# Patient Record
Sex: Female | Born: 1970
Health system: Southern US, Community
[De-identification: ages and names within clinical notes are randomized; demographics above are authoritative.]

## PROBLEM LIST (undated history)

## (undated) DIAGNOSIS — R51 Headache: Secondary | ICD-10-CM

## (undated) DIAGNOSIS — K219 Gastro-esophageal reflux disease without esophagitis: Secondary | ICD-10-CM

## (undated) DIAGNOSIS — M549 Dorsalgia, unspecified: Secondary | ICD-10-CM

## (undated) DIAGNOSIS — G8929 Other chronic pain: Secondary | ICD-10-CM

## (undated) DIAGNOSIS — R112 Nausea with vomiting, unspecified: Secondary | ICD-10-CM

## (undated) DIAGNOSIS — D649 Anemia, unspecified: Secondary | ICD-10-CM

## (undated) DIAGNOSIS — E079 Disorder of thyroid, unspecified: Secondary | ICD-10-CM

## (undated) DIAGNOSIS — F419 Anxiety disorder, unspecified: Secondary | ICD-10-CM

## (undated) DIAGNOSIS — R519 Headache, unspecified: Secondary | ICD-10-CM

## (undated) DIAGNOSIS — Z973 Presence of spectacles and contact lenses: Secondary | ICD-10-CM

## (undated) DIAGNOSIS — Z9889 Other specified postprocedural states: Secondary | ICD-10-CM

## (undated) HISTORY — PX: ECTOPIC PREGNANCY SURGERY: SHX613

## (undated) HISTORY — PX: CHOLECYSTECTOMY: SHX55

## (undated) HISTORY — PX: DILATION AND CURETTAGE OF UTERUS: SHX78

---

## 2006-09-10 ENCOUNTER — Ambulatory Visit (HOSPITAL_COMMUNITY): Payer: Self-pay | Admitting: Psychiatry

## 2006-10-01 ENCOUNTER — Ambulatory Visit (HOSPITAL_COMMUNITY): Payer: Self-pay | Admitting: Psychiatry

## 2006-11-01 ENCOUNTER — Ambulatory Visit (HOSPITAL_COMMUNITY): Payer: Self-pay | Admitting: Psychiatry

## 2007-08-15 ENCOUNTER — Other Ambulatory Visit: Admission: RE | Admit: 2007-08-15 | Discharge: 2007-08-15 | Payer: Self-pay | Admitting: Family Medicine

## 2007-11-27 ENCOUNTER — Encounter (INDEPENDENT_AMBULATORY_CARE_PROVIDER_SITE_OTHER): Payer: Self-pay | Admitting: Obstetrics and Gynecology

## 2007-11-27 ENCOUNTER — Observation Stay (HOSPITAL_COMMUNITY): Admission: AD | Admit: 2007-11-27 | Discharge: 2007-11-27 | Payer: Self-pay | Admitting: Obstetrics and Gynecology

## 2007-12-03 ENCOUNTER — Inpatient Hospital Stay (HOSPITAL_COMMUNITY): Admission: AD | Admit: 2007-12-03 | Discharge: 2007-12-03 | Payer: Self-pay | Admitting: Obstetrics and Gynecology

## 2007-12-06 ENCOUNTER — Ambulatory Visit: Admission: RE | Admit: 2007-12-06 | Discharge: 2007-12-06 | Payer: Self-pay | Admitting: Family Medicine

## 2007-12-11 ENCOUNTER — Inpatient Hospital Stay (HOSPITAL_COMMUNITY): Admission: AD | Admit: 2007-12-11 | Discharge: 2007-12-11 | Payer: Self-pay | Admitting: Obstetrics and Gynecology

## 2009-08-26 ENCOUNTER — Encounter: Admission: RE | Admit: 2009-08-26 | Discharge: 2009-08-26 | Payer: Self-pay | Admitting: Family Medicine

## 2010-12-02 NOTE — Op Note (Signed)
[name], [name]           ACCOUNT NO.:  [id number]   MEDICAL RECORD NO.:  [id number]          PATIENT TYPE:  OBV   LOCATION:  9318                          FACILITY:  [facility]   PHYSICIAN:  Osborn Coho, M.D.   DATE OF BIRTH:  1970-08-30   DATE OF PROCEDURE:  11/27/2007  DATE OF DISCHARGE:                               OPERATIVE REPORT   PREOPERATIVE DIAGNOSIS:  Ruptured right ectopic pregnancy.   POSTOPERATIVE DIAGNOSIS:  Aborted right ectopic pregnancy.   PROCEDURE:  1. Laparoscopy.  2. Removal of ectopic pregnancy.  3. Chromopertubation.   ATTENDING:  Osborn Coho, MD.   ASSISTANT:  Rhona Leavens, CNM   ANESTHESIA:  General.   FINDINGS:  Aborted ectopic pregnancy attached to bowel and posterior cul-  de-sac in appearance, normal-appearing appendix, bilateral free spill  from fallopian tubes, and normal-appearing ovaries and tubes.   SPECIMEN TO PATHOLOGY:  Ectopic tissue.   FLUIDS:  2100 mL.   URINE OUTPUT:  100 mL.   ESTIMATED BLOOD LOSS:  Approximately 100-200 mL of hemoperitoneum and  minimal blood loss intraoperatively.   COMPLICATIONS:  None.   PROCEDURE:  The patient was taken to the operating room after the risks,  benefits, and alternatives were discussed with the patient.  The patient  verbalized understanding. Consent signed and witnessed.  The patient was  placed under general anesthesia and prepped and draped in the normal  sterile fashion in the dorsal lithotomy position.  A single-tooth  tenaculum was placed on the anterior lip of the cervix.  An acorn  intrauterine manipulator was placed as well.  Attention was then turned  to the abdomen after gowning and re-gloving and a 10-mm incision was  made at the umbilicus.  Veress needle attempted to be introduced into  the intra-abdominal cavity but kept tracking in the subcutaneous tissue.  Open laparoscopy was then performed and the peritoneum entered.  The  Hasson was placed and  pneumoperitoneum achieved.  The laparoscope was  introduced and in the left lower quadrant, a 5-mm incision was made and  5-mm trocar advanced under direct visualization.  In the suprapubic  region, two fingerbreadths above the pelvic bone, a 10-mm incision was  made in the midline and trocar advanced under direct visualization.  Pneumoperitoneum was noted as mentioned above.  The intra-abdominal  cavity was copiously irrigated and hemoperitoneum evacuated in order to  identify the ectopic.  There was no obvious ectopic in either of the  fallopian tubes.  However, there was the appearance of what looked like  ectopic tissue adjacent to the right fallopian tube attached to the  bowel.  This tissue was removed and sent to pathology.  The remainder of  the intra-abdominal cavity was copiously irrigated and there was also  the appearance of ectopic tissue in the posterior cul-de-sac, which was  also removed.  Chromopertubation was performed and bilateral free spill  from fallopian tubes were noted.  The intra-abdominal cavity was  copiously irrigated once again and as much as possible the remaining  fluid in the intra-abdominal cavity was suctioned.  The appearance was  that an aborted ectopic  pregnancy from presumably the right fallopian  tube with no obvious remaining tissue in the tube.  The tube was  therefore left intact and decision was made to offer the patient  methotrexate and to follow the beta hCG down to 0, which would be  discussed with the patient following the procedure.  There was no  bleeding noted and the suprapubic and left lower quadrant trocars were  removed under direct visualization and pneumoperitoneum relieved.  The  Hasson was then removed under direct visualization and the fascia  repaired at the umbilicus with 0 Vicryl via a running stitch and the  skin reapproximated using 3-0 Monocryl.  At the suprapubic incision, the  fascia was repaired with 0 Vicryl as above  and skin repaired with 3-0  Monocryl via a subcuticular stitch.  The left lower quadrant was  repaired with Dermabond and Dermabond was placed on the 10-mm incisions  as well.  Sponge, lap, and needle count was correct.  The patient  tolerated the procedure well.  The tenaculum and acorn intrauterine  manipulator were removed and the patient was returned to the recovery  room in good condition.      Osborn Coho, M.D.  Electronically Signed     AR/MEDQ  D:  11/30/2007  T:  11/30/2007  Job:  [id number]

## 2011-04-15 LAB — HCG, QUANTITATIVE, PREGNANCY
hCG, Beta Chain, Quant, S: 3
hCG, Beta Chain, Quant, S: 57 — ABNORMAL HIGH

## 2012-12-31 ENCOUNTER — Encounter (HOSPITAL_BASED_OUTPATIENT_CLINIC_OR_DEPARTMENT_OTHER): Payer: Self-pay | Admitting: *Deleted

## 2012-12-31 ENCOUNTER — Emergency Department (HOSPITAL_BASED_OUTPATIENT_CLINIC_OR_DEPARTMENT_OTHER)
Admission: EM | Admit: 2012-12-31 | Discharge: 2012-12-31 | Disposition: A | Discharge status: Home / Self Care | Payer: Worker's Compensation | Attending: Emergency Medicine | Admitting: Emergency Medicine

## 2012-12-31 DIAGNOSIS — S29012A Strain of muscle and tendon of back wall of thorax, initial encounter: Secondary | ICD-10-CM

## 2012-12-31 DIAGNOSIS — Y9389 Activity, other specified: Secondary | ICD-10-CM | POA: Insufficient documentation

## 2012-12-31 DIAGNOSIS — Y9289 Other specified places as the place of occurrence of the external cause: Secondary | ICD-10-CM | POA: Insufficient documentation

## 2012-12-31 DIAGNOSIS — X503XXA Overexertion from repetitive movements, initial encounter: Secondary | ICD-10-CM | POA: Insufficient documentation

## 2012-12-31 DIAGNOSIS — S239XXA Sprain of unspecified parts of thorax, initial encounter: Secondary | ICD-10-CM | POA: Insufficient documentation

## 2012-12-31 DIAGNOSIS — Y990 Civilian activity done for income or pay: Secondary | ICD-10-CM | POA: Insufficient documentation

## 2012-12-31 DIAGNOSIS — Z79899 Other long term (current) drug therapy: Secondary | ICD-10-CM | POA: Insufficient documentation

## 2012-12-31 MED ORDER — DIAZEPAM 5 MG PO TABS
5.0000 mg | ORAL_TABLET | Freq: Once | ORAL | Status: AC
  Administered 2012-12-31: 5 mg via ORAL
  Filled 2012-12-31: qty 1

## 2012-12-31 MED ORDER — CYCLOBENZAPRINE HCL 10 MG PO TABS: 10.0000 mg | ORAL_TABLET | Freq: Two times a day (BID) | ORAL | Status: DC | PRN

## 2012-12-31 MED ORDER — KETOROLAC TROMETHAMINE 60 MG/2ML IM SOLN
60.0000 mg | Freq: Once | INTRAMUSCULAR | Status: AC
  Administered 2012-12-31: 60 mg via INTRAMUSCULAR
  Filled 2012-12-31: qty 2

## 2012-12-31 NOTE — ED Notes (Signed)
Pt states she injured her back and neck transferring a pt yesterday. Ambulatory to ED without difficulty.

## 2012-12-31 NOTE — ED Provider Notes (Signed)
Medical screening examination/treatment/procedure(s) were performed by non-physician practitioner and as supervising physician I was immediately available for consultation/collaboration.   Charles B. Bernette Mayers, MD 12/31/12 1191

## 2012-12-31 NOTE — ED Notes (Signed)
Kaitlyn, PAC at bedside.  

## 2012-12-31 NOTE — ED Provider Notes (Signed)
History     CSN: [id number]  Arrival date & time 12/31/12  1616   First MD Initiated Contact with Patient 12/31/12 1644      Chief Complaint  Patient presents with  . 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 The pain is aching and severe and does not radiate. The pain is constant. Movement makes the pain worse. Nothing makes the pain better. Patient has not tried anything for pain. No associated symptoms. No saddles paresthesias or bladder/bowel incontinence.      History reviewed. No pertinent past medical history.  Past Surgical History  Procedure Laterality Date  . Cholecystectomy      History reviewed. No pertinent family history.  History  Substance Use Topics  . Smoking status: Never Smoker   . Smokeless tobacco: Not on file  . Alcohol Use: Yes    OB History   Grav Para Term Preterm Abortions TAB SAB Ect Mult Living                  Review of Systems  Musculoskeletal: Positive for back pain.  All other systems reviewed and are negative.    Allergies  Review of patient's allergies indicates no known allergies.  Home Medications   Current Outpatient Rx  Name  Route  Sig  Dispense  Refill  . Norethin-Eth Estrad-Fe Biphas (LO LOESTRIN FE PO)   Oral   Take by mouth.         . sertraline (ZOLOFT) 100 MG tablet   Oral   Take 100 mg by mouth daily.           BP 118/75  Pulse 92  Temp(Src) 98.4 F (36.9 C) (Oral)  Resp 20  Ht 5\' 2"  (1.575 m)  Wt 240 lb (108.863 kg)  BMI 43.89 kg/m2  SpO2 97%  LMP 12/03/2012  Physical Exam  Nursing note and vitals reviewed. Constitutional: She is oriented to person, place, and time. She appears well-developed and well-nourished. No distress.  HENT:  Head: Normocephalic  and atraumatic.  Eyes: Conjunctivae are normal.  Neck: Normal range of motion.  Cardiovascular: Normal rate and regular rhythm.  Exam reveals no gallop and no friction rub.   No murmur heard. Pulmonary/Chest: Effort normal and breath sounds normal. She has no wheezes. She has no rales. She exhibits no tenderness.  Musculoskeletal: Normal range of motion.  Paraspinal mid-thoracic tenderness to palpation. No midline spine tenderness to palpation.   Neurological: She is alert and oriented to person, place, and time. Coordination normal.  Speech is goal-oriented. Moves limbs without ataxia.   Skin: Skin is warm and dry.  Psychiatric: She has a normal mood and affect. Her behavior is normal.    ED Course  Procedures (including critical care time)  Labs Reviewed - No data to display No results found.   1. Rhomboid muscle strain, initial encounter       MDM  5:50 PM Patient given IM toradol and PO valium. Patient likely has musculoskeletal strain. Patient will be discharged with Flexeril and instructed to apply heat. Patient states she has naproxen at home which I advised her to take along with her Flexeril. No bladder/bowel incontinence or saddle paresthesias. Patient instructed to return with worsening or concerning symptoms.         Emilia Beck, PA-C  12/31/12 1755 

## 2014-04-30 ENCOUNTER — Other Ambulatory Visit: Payer: Self-pay | Admitting: Family Medicine

## 2014-04-30 DIAGNOSIS — Z1239 Encounter for other screening for malignant neoplasm of breast: Secondary | ICD-10-CM

## 2014-05-01 ENCOUNTER — Ambulatory Visit: Payer: Self-pay

## 2014-05-08 ENCOUNTER — Ambulatory Visit: Payer: Self-pay

## 2014-05-10 ENCOUNTER — Other Ambulatory Visit: Payer: Self-pay | Admitting: Family Medicine

## 2014-05-10 DIAGNOSIS — Z1231 Encounter for screening mammogram for malignant neoplasm of breast: Secondary | ICD-10-CM

## 2014-05-16 ENCOUNTER — Ambulatory Visit
Admission: RE | Admit: 2014-05-16 | Discharge: 2014-05-16 | Disposition: A | Discharge status: Home / Self Care | Payer: 59 | Source: Ambulatory Visit | Attending: Family Medicine | Admitting: Family Medicine

## 2014-05-16 DIAGNOSIS — Z1231 Encounter for screening mammogram for malignant neoplasm of breast: Secondary | ICD-10-CM

## 2014-11-07 ENCOUNTER — Encounter (HOSPITAL_COMMUNITY): Payer: Self-pay

## 2014-11-07 ENCOUNTER — Encounter (HOSPITAL_COMMUNITY)
Admission: RE | Admit: 2014-11-07 | Discharge: 2014-11-07 | Disposition: A | Discharge status: Home / Self Care | Payer: 59 | Source: Ambulatory Visit | Attending: Orthopedic Surgery | Admitting: Orthopedic Surgery

## 2014-11-07 DIAGNOSIS — W19XXXA Unspecified fall, initial encounter: Secondary | ICD-10-CM | POA: Diagnosis not present

## 2014-11-07 DIAGNOSIS — F419 Anxiety disorder, unspecified: Secondary | ICD-10-CM | POA: Diagnosis not present

## 2014-11-07 DIAGNOSIS — M549 Dorsalgia, unspecified: Secondary | ICD-10-CM | POA: Diagnosis not present

## 2014-11-07 DIAGNOSIS — K219 Gastro-esophageal reflux disease without esophagitis: Secondary | ICD-10-CM | POA: Diagnosis not present

## 2014-11-07 DIAGNOSIS — Z79899 Other long term (current) drug therapy: Secondary | ICD-10-CM | POA: Diagnosis not present

## 2014-11-07 DIAGNOSIS — G8929 Other chronic pain: Secondary | ICD-10-CM | POA: Diagnosis not present

## 2014-11-07 DIAGNOSIS — D649 Anemia, unspecified: Secondary | ICD-10-CM | POA: Diagnosis not present

## 2014-11-07 DIAGNOSIS — Y929 Unspecified place or not applicable: Secondary | ICD-10-CM | POA: Diagnosis not present

## 2014-11-07 DIAGNOSIS — S46012A Strain of muscle(s) and tendon(s) of the rotator cuff of left shoulder, initial encounter: Secondary | ICD-10-CM | POA: Diagnosis present

## 2014-11-07 HISTORY — DX: Anemia, unspecified: D64.9

## 2014-11-07 HISTORY — DX: Gastro-esophageal reflux disease without esophagitis: K21.9

## 2014-11-07 HISTORY — DX: Headache: R51

## 2014-11-07 HISTORY — DX: Other specified postprocedural states: Z98.890

## 2014-11-07 HISTORY — DX: Headache, unspecified: R51.9

## 2014-11-07 HISTORY — DX: Dorsalgia, unspecified: M54.9

## 2014-11-07 HISTORY — DX: Other chronic pain: G89.29

## 2014-11-07 HISTORY — DX: Other specified postprocedural states: R11.2

## 2014-11-07 HISTORY — DX: Anxiety disorder, unspecified: F41.9

## 2014-11-07 HISTORY — DX: Presence of spectacles and contact lenses: Z97.3

## 2014-11-07 LAB — CBC WITH DIFFERENTIAL/PLATELET
Basophils Absolute: 0 10*3/uL (ref 0.0–0.1)
Basophils Relative: 0 % (ref 0–1)
EOS ABS: 0.1 10*3/uL (ref 0.0–0.7)
Eosinophils Relative: 2 % (ref 0–5)
HCT: 32.8 % — ABNORMAL LOW (ref 36.0–46.0)
HEMOGLOBIN: 10.5 g/dL — AB (ref 12.0–15.0)
LYMPHS ABS: 1.8 10*3/uL (ref 0.7–4.0)
Lymphocytes Relative: 22 % (ref 12–46)
MCH: 23.6 pg — AB (ref 26.0–34.0)
MCHC: 32 g/dL (ref 30.0–36.0)
MCV: 73.7 fL — ABNORMAL LOW (ref 78.0–100.0)
Monocytes Absolute: 0.5 10*3/uL (ref 0.1–1.0)
Monocytes Relative: 6 % (ref 3–12)
NEUTROS PCT: 70 % (ref 43–77)
Neutro Abs: 5.7 10*3/uL (ref 1.7–7.7)
Platelets: 284 10*3/uL (ref 150–400)
RBC: 4.45 MIL/uL (ref 3.87–5.11)
RDW: 15.2 % (ref 11.5–15.5)
WBC: 8.1 10*3/uL (ref 4.0–10.5)

## 2014-11-07 LAB — PROTIME-INR
INR: 1.03 (ref 0.00–1.49)
Prothrombin Time: 13.6 seconds (ref 11.6–15.2)

## 2014-11-07 LAB — COMPREHENSIVE METABOLIC PANEL
ALT: 19 U/L (ref 0–35)
ANION GAP: 8 (ref 5–15)
AST: 17 U/L (ref 0–37)
Albumin: 3.3 g/dL — ABNORMAL LOW (ref 3.5–5.2)
Alkaline Phosphatase: 73 U/L (ref 39–117)
BUN: 10 mg/dL (ref 6–23)
CALCIUM: 8.3 mg/dL — AB (ref 8.4–10.5)
CO2: 24 mmol/L (ref 19–32)
CREATININE: 0.73 mg/dL (ref 0.50–1.10)
Chloride: 107 mmol/L (ref 96–112)
GLUCOSE: 87 mg/dL (ref 70–99)
Potassium: 4 mmol/L (ref 3.5–5.1)
Sodium: 139 mmol/L (ref 135–145)
TOTAL PROTEIN: 6.5 g/dL (ref 6.0–8.3)
Total Bilirubin: 0.5 mg/dL (ref 0.3–1.2)

## 2014-11-07 LAB — HCG, SERUM, QUALITATIVE: Preg, Serum: NEGATIVE

## 2014-11-07 LAB — APTT: APTT: 28 s (ref 24–37)

## 2014-11-07 MED ORDER — CEFAZOLIN SODIUM-DEXTROSE 2-3 GM-% IV SOLR
2.0000 g | INTRAVENOUS | Status: AC
  Administered 2014-11-08: 2 g via INTRAVENOUS

## 2014-11-07 MED ORDER — CHLORHEXIDINE GLUCONATE 4 % EX LIQD
60.0000 mL | Freq: Once | CUTANEOUS | Status: DC
  Filled 2014-11-07: qty 60

## 2014-11-07 MED ORDER — LACTATED RINGERS IV SOLN
INTRAVENOUS | Status: DC
  Administered 2014-11-08 (×2): via INTRAVENOUS

## 2014-11-07 NOTE — Pre-Procedure Instructions (Signed)
[name]  11/07/2014   Your procedure is scheduled on: Thursday, November 08, 2014  [facility] at 5:30 AM.  Call this number if you have problems the morning of surgery: [phone]   Remember:   Do not eat food or drink liquids after midnight.   Take these medicines the morning of surgery with A SIP OF WATER: sertraline (ZOLOFT), loratadine (CLARITIN), if needed: ALPRAZolam Prudy Feeler(XANAX) for anxiety, famotidine (PEPCID) heartburn or indigestion, HYDROcodone-acetaminophen (NORCO/VICODIN) for pain  Stop taking Aspirin, vitamins and herbal medications. Do not take any NSAIDs ie: Ibuprofen, Advil, Naproxen ( Naproysn)  or any medication containing Aspirin; stop now.   Do not wear jewelry, make-up or nail polish.  Do not wear lotions, powders, or perfumes. You may not wear deodorant.  Do not shave 48 hours prior to surgery.   Do not bring valuables to the hospital.  [facility] is not responsible for any belongings or valuables.               Contacts, dentures or bridgework may not be worn into surgery.  Leave suitcase in the car. After surgery it may be brought to your room.  For patients admitted to the hospital, discharge time is determined by your treatment team.               Patients discharged the day of surgery will not be allowed to drive home.  Name and phone number of your driver:   Special Instructions:  Special Instructions:Special Instructions: [facility] - Pre[facility]  Before surgery, you can play an important role.  Because skin is not sterile, your skin needs to be as free of germs as possible.  You can reduce the number of germs on you skin by washing with CHG (chlorahexidine gluconate) soap before surgery.  CHG is an antiseptic cleaner which kills germs and bonds with the skin to continue killing germs even after washing.  Please DO NOT use if you have an allergy to CHG or antibacterial soaps.  If your skin becomes  reddened/irritated stop using the CHG and inform your nurse when you arrive at Short Stay.  Do not shave (including legs and underarms) for at least 48 hours prior to the first CHG shower.  You may shave your face.  Please follow these instructions carefully:   1.  Shower with CHG Soap the night before surgery and the morning of Surgery.  2.  If you choose to wash your hair, wash your hair first as usual with your normal shampoo.  3.  After you shampoo, rinse your hair and body thoroughly to remove the Shampoo.  4.  Use CHG as you would any other liquid soap.  You can apply chg directly  to the skin and wash gently with scrungie or a clean washcloth.  5.  Apply the CHG Soap to your body ONLY FROM THE NECK DOWN.  Do not use on open wounds or open sores.  Avoid contact with your eyes, ears, mouth and genitals (private parts).  Wash genitals (private parts) with your normal soap.  6.  Wash thoroughly, paying special attention to the area where your surgery will be performed.  7.  Thoroughly rinse your body with warm water from the neck down.  8.  DO NOT shower/wash with your normal soap after using and rinsing off the CHG Soap.  9.  Pat yourself dry with a clean towel.  10.  Wear clean pajamas.            11.  Place clean sheets on your bed the night of your first shower and do not sleep with pets.  Day of Surgery  Do not apply any lotions/deodorants the morning of surgery.  Please wear clean clothes to the hospital/surgery center.   Please read over the following fact sheets that you were given: Pain Booklet, Coughing and Deep Breathing and Surgical Site Infection Prevention

## 2014-11-07 NOTE — Progress Notes (Signed)
Pt denies SOB, chest pain, and being under the care of a cardiologist. Pt denies having a chest x ray and EKG in the last year. Pt denies having a stress test, echo and cardiac cath.

## 2014-11-08 ENCOUNTER — Encounter (HOSPITAL_COMMUNITY)
Admission: RE | Disposition: A | Discharge status: Home / Self Care | Payer: Self-pay | Source: Ambulatory Visit | Attending: Orthopedic Surgery

## 2014-11-08 ENCOUNTER — Ambulatory Visit (HOSPITAL_COMMUNITY): Payer: 59 | Admitting: Anesthesiology

## 2014-11-08 ENCOUNTER — Ambulatory Visit (HOSPITAL_COMMUNITY)
Admission: RE | Admit: 2014-11-08 | Discharge: 2014-11-08 | Disposition: A | Discharge status: Home / Self Care | Payer: 59 | Source: Ambulatory Visit | Attending: Orthopedic Surgery | Admitting: Orthopedic Surgery

## 2014-11-08 ENCOUNTER — Encounter (HOSPITAL_COMMUNITY): Payer: Self-pay | Admitting: *Deleted

## 2014-11-08 DIAGNOSIS — M549 Dorsalgia, unspecified: Secondary | ICD-10-CM | POA: Insufficient documentation

## 2014-11-08 DIAGNOSIS — K219 Gastro-esophageal reflux disease without esophagitis: Secondary | ICD-10-CM | POA: Insufficient documentation

## 2014-11-08 DIAGNOSIS — Y929 Unspecified place or not applicable: Secondary | ICD-10-CM | POA: Insufficient documentation

## 2014-11-08 DIAGNOSIS — S46012A Strain of muscle(s) and tendon(s) of the rotator cuff of left shoulder, initial encounter: Secondary | ICD-10-CM | POA: Diagnosis not present

## 2014-11-08 DIAGNOSIS — F419 Anxiety disorder, unspecified: Secondary | ICD-10-CM | POA: Insufficient documentation

## 2014-11-08 DIAGNOSIS — G8929 Other chronic pain: Secondary | ICD-10-CM | POA: Insufficient documentation

## 2014-11-08 DIAGNOSIS — D649 Anemia, unspecified: Secondary | ICD-10-CM | POA: Insufficient documentation

## 2014-11-08 DIAGNOSIS — W19XXXA Unspecified fall, initial encounter: Secondary | ICD-10-CM | POA: Insufficient documentation

## 2014-11-08 DIAGNOSIS — Z79899 Other long term (current) drug therapy: Secondary | ICD-10-CM | POA: Insufficient documentation

## 2014-11-08 HISTORY — PX: SHOULDER ARTHROSCOPY WITH SUBACROMIAL DECOMPRESSION: SHX5684

## 2014-11-08 SURGERY — SHOULDER ARTHROSCOPY WITH SUBACROMIAL DECOMPRESSION
Anesthesia: Regional | Site: Shoulder | Laterality: Left

## 2014-11-08 MED ORDER — ARTIFICIAL TEARS OP OINT
TOPICAL_OINTMENT | OPHTHALMIC | Status: AC
  Filled 2014-11-08: qty 3.5

## 2014-11-08 MED ORDER — EPHEDRINE SULFATE 50 MG/ML IJ SOLN
INTRAMUSCULAR | Status: DC | PRN
  Administered 2014-11-08 (×3): 10 mg via INTRAVENOUS

## 2014-11-08 MED ORDER — DEXAMETHASONE SODIUM PHOSPHATE 4 MG/ML IJ SOLN
INTRAMUSCULAR | Status: DC | PRN
  Administered 2014-11-08: 4 mg via INTRAVENOUS

## 2014-11-08 MED ORDER — BUPIVACAINE-EPINEPHRINE (PF) 0.5% -1:200000 IJ SOLN
INTRAMUSCULAR | Status: DC | PRN
  Administered 2014-11-08: 25 mL via PERINEURAL

## 2014-11-08 MED ORDER — OXYCODONE HCL 5 MG PO TABS: 5.0000 mg | ORAL_TABLET | Freq: Once | ORAL | Status: DC | PRN

## 2014-11-08 MED ORDER — OXYCODONE-ACETAMINOPHEN 5-325 MG PO TABS: 1.0000 | ORAL_TABLET | ORAL | Status: DC | PRN

## 2014-11-08 MED ORDER — NEOSTIGMINE METHYLSULFATE 10 MG/10ML IV SOLN
INTRAVENOUS | Status: DC | PRN
  Administered 2014-11-08: 3 mg via INTRAVENOUS

## 2014-11-08 MED ORDER — DIAZEPAM 5 MG PO TABS
2.5000 mg | ORAL_TABLET | Freq: Four times a day (QID) | ORAL | Status: DC | PRN
Start: 2014-11-08 — End: 2017-12-29

## 2014-11-08 MED ORDER — GLYCOPYRROLATE 0.2 MG/ML IJ SOLN
INTRAMUSCULAR | Status: AC
  Filled 2014-11-08: qty 3

## 2014-11-08 MED ORDER — PROPOFOL 10 MG/ML IV BOLUS
INTRAVENOUS | Status: DC | PRN
  Administered 2014-11-08: 230 mg via INTRAVENOUS
  Administered 2014-11-08: 30 mg via INTRAVENOUS

## 2014-11-08 MED ORDER — SUCCINYLCHOLINE CHLORIDE 20 MG/ML IJ SOLN
INTRAMUSCULAR | Status: AC
  Filled 2014-11-08: qty 1

## 2014-11-08 MED ORDER — ONDANSETRON HCL 4 MG/2ML IJ SOLN
INTRAMUSCULAR | Status: DC | PRN
  Administered 2014-11-08: 4 mg via INTRAVENOUS

## 2014-11-08 MED ORDER — SCOPOLAMINE 1 MG/3DAYS TD PT72
MEDICATED_PATCH | TRANSDERMAL | Status: AC
  Filled 2014-11-08: qty 1

## 2014-11-08 MED ORDER — PHENYLEPHRINE 40 MCG/ML (10ML) SYRINGE FOR IV PUSH (FOR BLOOD PRESSURE SUPPORT)
PREFILLED_SYRINGE | INTRAVENOUS | Status: AC
  Filled 2014-11-08: qty 10

## 2014-11-08 MED ORDER — SODIUM CHLORIDE 0.9 % IJ SOLN
INTRAMUSCULAR | Status: AC
  Filled 2014-11-08: qty 10

## 2014-11-08 MED ORDER — LIDOCAINE HCL (CARDIAC) 20 MG/ML IV SOLN
INTRAVENOUS | Status: AC
  Filled 2014-11-08: qty 5

## 2014-11-08 MED ORDER — PROPOFOL 10 MG/ML IV BOLUS
INTRAVENOUS | Status: AC
  Filled 2014-11-08: qty 20

## 2014-11-08 MED ORDER — ROCURONIUM BROMIDE 50 MG/5ML IV SOLN
INTRAVENOUS | Status: AC
  Filled 2014-11-08: qty 1

## 2014-11-08 MED ORDER — ONDANSETRON HCL 4 MG PO TABS: 4.0000 mg | ORAL_TABLET | Freq: Three times a day (TID) | ORAL | Status: AC | PRN

## 2014-11-08 MED ORDER — EPHEDRINE SULFATE 50 MG/ML IJ SOLN
INTRAMUSCULAR | Status: AC
  Filled 2014-11-08: qty 1

## 2014-11-08 MED ORDER — ROCURONIUM BROMIDE 100 MG/10ML IV SOLN
INTRAVENOUS | Status: DC | PRN
  Administered 2014-11-08: 50 mg via INTRAVENOUS

## 2014-11-08 MED ORDER — NAPROXEN 500 MG PO TABS: 500.0000 mg | ORAL_TABLET | Freq: Two times a day (BID) | ORAL | Status: DC

## 2014-11-08 MED ORDER — MIDAZOLAM HCL 2 MG/2ML IJ SOLN
INTRAMUSCULAR | Status: AC
  Filled 2014-11-08: qty 2

## 2014-11-08 MED ORDER — ONDANSETRON HCL 4 MG/2ML IJ SOLN
INTRAMUSCULAR | Status: AC
  Filled 2014-11-08: qty 2

## 2014-11-08 MED ORDER — SODIUM CHLORIDE 0.9 % IR SOLN
Status: DC | PRN
  Administered 2014-11-08: 6000 mL

## 2014-11-08 MED ORDER — MIDAZOLAM HCL 5 MG/5ML IJ SOLN
INTRAMUSCULAR | Status: DC | PRN
  Administered 2014-11-08: 2 mg via INTRAVENOUS

## 2014-11-08 MED ORDER — GLYCOPYRROLATE 0.2 MG/ML IJ SOLN
INTRAMUSCULAR | Status: DC | PRN
  Administered 2014-11-08: 0.4 mg via INTRAVENOUS

## 2014-11-08 MED ORDER — PHENYLEPHRINE HCL 10 MG/ML IJ SOLN
INTRAMUSCULAR | Status: DC | PRN
  Administered 2014-11-08 (×4): 80 ug via INTRAVENOUS

## 2014-11-08 MED ORDER — FENTANYL CITRATE (PF) 250 MCG/5ML IJ SOLN
INTRAMUSCULAR | Status: AC
Start: 2014-11-08 — End: 2014-11-08
  Filled 2014-11-08: qty 5

## 2014-11-08 MED ORDER — NEOSTIGMINE METHYLSULFATE 10 MG/10ML IV SOLN
INTRAVENOUS | Status: AC
  Filled 2014-11-08: qty 1

## 2014-11-08 MED ORDER — FENTANYL CITRATE (PF) 100 MCG/2ML IJ SOLN
INTRAMUSCULAR | Status: DC | PRN
  Administered 2014-11-08 (×3): 50 ug via INTRAVENOUS

## 2014-11-08 MED ORDER — SCOPOLAMINE 1 MG/3DAYS TD PT72
MEDICATED_PATCH | TRANSDERMAL | Status: DC | PRN
  Administered 2014-11-08: 1 via TRANSDERMAL

## 2014-11-08 MED ORDER — PROMETHAZINE HCL 25 MG/ML IJ SOLN: 6.2500 mg | INTRAMUSCULAR | Status: DC | PRN

## 2014-11-08 MED ORDER — OXYCODONE HCL 5 MG/5ML PO SOLN: 5.0000 mg | Freq: Once | ORAL | Status: DC | PRN

## 2014-11-08 MED ORDER — HYDROMORPHONE HCL 1 MG/ML IJ SOLN: 0.2500 mg | INTRAMUSCULAR | Status: DC | PRN

## 2014-11-08 MED ORDER — ARTIFICIAL TEARS OP OINT
TOPICAL_OINTMENT | OPHTHALMIC | Status: DC | PRN
  Administered 2014-11-08: 1 via OPHTHALMIC

## 2014-11-08 SURGICAL SUPPLY — 63 items
ANCHOR PEEK 4.75X19.1 SWLK C (Anchor) ×4 IMPLANT
BLADE CUTTER GATOR 3.5 (BLADE) ×2 IMPLANT
BLADE GREAT WHITE 4.2 (BLADE) ×2 IMPLANT
BLADE SURG 11 STRL SS (BLADE) ×2 IMPLANT
BOOTCOVER CLEANROOM LRG (PROTECTIVE WEAR) ×2 IMPLANT
BUR 3.5 LG SPHERICAL (BURR) IMPLANT
BUR OVAL 4.0 (BURR) ×2 IMPLANT
BURR 3.5 LG SPHERICAL (BURR)
CANISTER SUCT LVC 12 LTR MEDI- (MISCELLANEOUS) ×2 IMPLANT
CANNULA ACUFLEX KIT 5X76 (CANNULA) ×2 IMPLANT
CANNULA DRILOCK 5.0X75 (CANNULA) ×2 IMPLANT
CANNULA TWIST IN 8.25X7CM (CANNULA) ×2 IMPLANT
CLSR STERI-STRIP ANTIMIC 1/2X4 (GAUZE/BANDAGES/DRESSINGS) ×2 IMPLANT
CONNECTOR 5 IN 1 STRAIGHT STRL (MISCELLANEOUS) ×2 IMPLANT
DRAPE INCISE 23X17 IOBAN STRL (DRAPES) ×1
DRAPE INCISE IOBAN 23X17 STRL (DRAPES) ×1 IMPLANT
DRAPE INCISE IOBAN 66X45 STRL (DRAPES) ×2 IMPLANT
DRAPE STERI 35X30 U-POUCH (DRAPES) ×2 IMPLANT
DRAPE SURG 17X11 SM STRL (DRAPES) ×2 IMPLANT
DRAPE U-SHAPE 47X51 STRL (DRAPES) ×2 IMPLANT
DRSG PAD ABDOMINAL 8X10 ST (GAUZE/BANDAGES/DRESSINGS) ×2 IMPLANT
DURAPREP 26ML APPLICATOR (WOUND CARE) ×2 IMPLANT
ELECT REM PT RETURN 9FT ADLT (ELECTROSURGICAL) ×2
ELECTRODE REM PT RTRN 9FT ADLT (ELECTROSURGICAL) ×1 IMPLANT
GAUZE SPONGE 4X4 12PLY STRL (GAUZE/BANDAGES/DRESSINGS) ×2 IMPLANT
GLOVE BIO SURGEON STRL SZ7.5 (GLOVE) ×2 IMPLANT
GLOVE BIO SURGEON STRL SZ8 (GLOVE) ×2 IMPLANT
GLOVE EUDERMIC 7 POWDERFREE (GLOVE) ×2 IMPLANT
GLOVE ORTHO TXT STRL SZ7.5 (GLOVE) ×2 IMPLANT
GLOVE SS BIOGEL STRL SZ 7.5 (GLOVE) ×1 IMPLANT
GLOVE SUPERSENSE BIOGEL SZ 7.5 (GLOVE) ×1
GOWN STRL REUS W/ TWL LRG LVL3 (GOWN DISPOSABLE) ×1 IMPLANT
GOWN STRL REUS W/ TWL XL LVL3 (GOWN DISPOSABLE) ×2 IMPLANT
GOWN STRL REUS W/TWL LRG LVL3 (GOWN DISPOSABLE) ×1
GOWN STRL REUS W/TWL XL LVL3 (GOWN DISPOSABLE) ×2
KIT BASIN OR (CUSTOM PROCEDURE TRAY) ×2 IMPLANT
KIT ROOM TURNOVER OR (KITS) ×2 IMPLANT
KIT SHOULDER TRACTION (DRAPES) ×2 IMPLANT
MANIFOLD NEPTUNE II (INSTRUMENTS) ×2 IMPLANT
NDL SUT 6 .5 CRC .975X.05 MAYO (NEEDLE) IMPLANT
NEEDLE MAYO TAPER (NEEDLE)
NEEDLE SCORPION MULTI FIRE (NEEDLE) ×2 IMPLANT
NEEDLE SPNL 18GX3.5 QUINCKE PK (NEEDLE) ×2 IMPLANT
NS IRRIG 1000ML POUR BTL (IV SOLUTION) IMPLANT
PACK SHOULDER (CUSTOM PROCEDURE TRAY) ×2 IMPLANT
PAD ABD 8X10 STRL (GAUZE/BANDAGES/DRESSINGS) ×2 IMPLANT
PAD ARMBOARD 7.5X6 YLW CONV (MISCELLANEOUS) ×4 IMPLANT
SET ARTHROSCOPY TUBING (MISCELLANEOUS) ×1
SET ARTHROSCOPY TUBING LN (MISCELLANEOUS) ×1 IMPLANT
SLING ARM LRG ADULT FOAM STRAP (SOFTGOODS) IMPLANT
SLING ARM MED ADULT FOAM STRAP (SOFTGOODS) IMPLANT
SPONGE LAP 4X18 X RAY DECT (DISPOSABLE) IMPLANT
STRIP CLOSURE SKIN 1/2X4 (GAUZE/BANDAGES/DRESSINGS) IMPLANT
SUT MNCRL AB 3-0 PS2 18 (SUTURE) ×2 IMPLANT
SUT PDS AB 0 CT 36 (SUTURE) IMPLANT
SUT RETRIEVER GRASP 30 DEG (SUTURE) IMPLANT
SUT TIGER TAPE 7 IN WHITE (SUTURE) ×2 IMPLANT
SYR 20CC LL (SYRINGE) IMPLANT
TAPE PAPER 3X10 WHT MICROPORE (GAUZE/BANDAGES/DRESSINGS) IMPLANT
TOWEL OR 17X24 6PK STRL BLUE (TOWEL DISPOSABLE) ×2 IMPLANT
TOWEL OR 17X26 10 PK STRL BLUE (TOWEL DISPOSABLE) ×2 IMPLANT
WAND SUCTION MAX 4MM 90S (SURGICAL WAND) ×2 IMPLANT
WATER STERILE IRR 1000ML POUR (IV SOLUTION) ×2 IMPLANT

## 2014-11-08 NOTE — H&P (Signed)
[name], [name]    Chief Complaint: LEFT SHOULDER ROTATOR CUFF TEAR HPI: The patient is a 44 y.o. female with left shoulder pain and weakness after a fall with MRI evidence of a large rotator cuff tear  Past Medical History  Diagnosis Date  . Anxiety   . Chronic back pain   . Anemia   . Headache     migraines  . PONV (postoperative nausea and vomiting)   . Wears glasses   . GERD (gastroesophageal reflux disease)     Past Surgical History  Procedure Laterality Date  . Cholecystectomy    . Ectopic pregnancy surgery    . Dilation and curettage of uterus      Family History  Problem Relation Age of Onset  . Hypercholesterolemia Mother   . Diabetes Father   . Hypertension Father     Social History:  reports that she has never smoked. She has never used smokeless tobacco. She reports that she drinks alcohol. She reports that she does not use illicit drugs.  Allergies: No Known Allergies  Medications Prior to Admission  Medication Sig Dispense Refill  . ALPRAZolam (XANAX) 0.5 MG tablet Take 0.5 mg by mouth at bedtime as needed for anxiety.    . docusate sodium (COLACE) 100 MG capsule Take 100 mg by mouth 2 (two) times daily as needed for mild constipation.    . famotidine (PEPCID) 20 MG tablet Take 20 mg by mouth 2 (two) times daily as needed for heartburn or indigestion.    Marland Kitchen. HYDROcodone-acetaminophen (NORCO/VICODIN) 5-325 MG per tablet Take 1 tablet by mouth every 6 (six) hours as needed for moderate pain.    Marland Kitchen. loratadine (CLARITIN) 10 MG tablet Take 10 mg by mouth daily.    . naproxen (NAPROSYN) 500 MG tablet Take 500 mg by mouth 2 (two) times daily as needed for mild pain.    Marland Kitchen. sertraline (ZOLOFT) 100 MG tablet Take 100 mg by mouth daily.       Physical Exam: left shoulder with painful and restricted motion as noted at recent office visit  Vitals  Temp:  [98.3 F (36.8 C)-98.9 F (37.2 C)] 98.3 F (36.8 C) (04/21 0615) Pulse Rate:  [74-108] 102 (04/21  0714) Resp:  [18-20] 18 (04/21 0615) BP: (122-129)/(52-68) 122/52 mmHg (04/21 0615) SpO2:  [97 %-99 %] 99 % (04/21 0615) Weight:  [117.028 kg (258 lb)-117.3 kg (258 lb 9.6 oz)] 117.028 kg (258 lb) (04/21 0615)  Assessment/Plan  Impression: LEFT SHOULDER ROTATOR CUFF TEAR  Plan of Action: Procedure(s): LEFT SHOULDER ARTHROSCOPY SUBACROMIAL DECOMPRESSION AND DISTAL CLAVICAL RESECTION and rotator cuff repair  [name] M 11/08/2014, 7:14 AM Contact # [phone]

## 2014-11-08 NOTE — Anesthesia Procedure Notes (Addendum)
Anesthesia Regional Block:  Interscalene brachial plexus block  Pre-Anesthetic Checklist: ,, timeout performed, Correct Patient, Correct Site, Correct Laterality, Correct Procedure, Correct Position, site marked, Risks and benefits discussed,  Surgical consent,  Pre-op evaluation,  At surgeon's request and post-op pain management  Laterality: Left  Prep: chloraprep       Needles:  Injection technique: Single-shot  Needle Type: Echogenic Stimulator Needle     Needle Length: 9cm 9 cm Needle Gauge: 21 and 21 G    Additional Needles:  Procedures: ultrasound guided (picture in chart) and nerve stimulator Interscalene brachial plexus block  Nerve Stimulator or Paresthesia:  Response: deltoid, 0.4 mA,   Additional Responses:   Narrative:  Start time: 11/08/2014 6:55 AM End time: 11/08/2014 7:05 AM Injection made incrementally with aspirations every 5 mL.  Performed by: Personally  Anesthesiologist: Marcene DuosFITZGERALD, ROBERT  Additional Notes: Risks and benefits explained. Pt tolerated well with no immediate complications.   Procedure Name: Intubation Date/Time: 11/08/2014 7:42 AM Performed by: Roney MansSMITH, Errika Narvaiz P Pre-anesthesia Checklist: Patient identified, Emergency Drugs available, Suction available, Patient being monitored and Timeout performed Patient Re-evaluated:Patient Re-evaluated prior to inductionOxygen Delivery Method: Circle system utilized Preoxygenation: Pre-oxygenation with 100% oxygen Intubation Type: IV induction Ventilation: Mask ventilation without difficulty Laryngoscope Size: Mac and 3 Grade View: Grade I Tube type: Oral Tube size: 7.0 mm Number of attempts: 1 Airway Equipment and Method: Stylet Placement Confirmation: ETT inserted through vocal cords under direct vision,  positive ETCO2 and breath sounds checked- equal and bilateral Secured at: 22 cm Tube secured with: Tape Dental Injury: Teeth and Oropharynx as per pre-operative assessment

## 2014-11-08 NOTE — Anesthesia Postprocedure Evaluation (Signed)
  Anesthesia Post-op Note  Patient: [name]  Procedure(s) Performed: Procedure(s): LEFT SHOULDER ARTHROSCOPY SUBACROMIAL DECOMPRESSION AND DISTAL CLAVICAL RESECTION (Left)  Patient Location: PACU  Anesthesia Type:GA combined with regional for post-op pain  Level of Consciousness: awake and alert   Airway and Oxygen Therapy: Patient Spontanous Breathing  Post-op Pain: none  Post-op Assessment: Post-op Vital signs reviewed  Post-op Vital Signs: Reviewed  Last Vitals:  Filed Vitals:   11/08/14 1109  BP: 122/69  Pulse: 71  Temp:   Resp: 18    Complications: No apparent anesthesia complications

## 2014-11-08 NOTE — Anesthesia Preprocedure Evaluation (Addendum)
Anesthesia Evaluation  Patient identified by MRN, date of birth, ID band Patient awake    Reviewed: Allergy & Precautions, NPO status , Patient's Chart, lab work & pertinent test results  History of Anesthesia Complications (+) PONV  Airway Mallampati: III  TM Distance: >3 FB Neck ROM: Full    Dental  (+) Teeth Intact, Dental Advisory Given   Pulmonary neg pulmonary ROS,  breath sounds clear to auscultation        Cardiovascular negative cardio ROS  Rhythm:Regular Rate:Normal     Neuro/Psych  Headaches, Anxiety    GI/Hepatic Neg liver ROS, GERD-  Medicated,  Endo/Other  Morbid obesity  Renal/GU negative Renal ROS     Musculoskeletal   Abdominal   Peds  Hematology  (+) anemia , hgb 10.4   Anesthesia Other Findings   Reproductive/Obstetrics                            Anesthesia Physical Anesthesia Plan  ASA: III  Anesthesia Plan: General and Regional   Post-op Pain Management:    Induction: Intravenous  Airway Management Planned: Oral ETT  Additional Equipment:   Intra-op Plan:   Post-operative Plan: Extubation in OR  Informed Consent: I have reviewed the patients History and Physical, chart, labs and discussed the procedure including the risks, benefits and alternatives for the proposed anesthesia with the patient or authorized representative who has indicated his/her understanding and acceptance.   Dental advisory given  Plan Discussed with: CRNA  Anesthesia Plan Comments:         Anesthesia Quick Evaluation

## 2014-11-08 NOTE — Transfer of Care (Signed)
Immediate Anesthesia Transfer of Care Note  Patient: [name]  Procedure(s) Performed: Procedure(s): LEFT SHOULDER ARTHROSCOPY SUBACROMIAL DECOMPRESSION AND DISTAL CLAVICAL RESECTION (Left)  Patient Location: PACU  Anesthesia Type:General  Level of Consciousness: awake, alert , oriented and patient cooperative  Airway & Oxygen Therapy: Patient Spontanous Breathing and Patient connected to nasal cannula oxygen  Post-op Assessment: Report given to RN and Post -op Vital signs reviewed and stable  Post vital signs: Reviewed and stable  Complications: No apparent anesthesia complications

## 2014-11-08 NOTE — Op Note (Signed)
[name], [name]           ACCOUNT NO.:  [id number]  MEDICAL RECORD NO.:  [id number]  LOCATION:  [facility]                         FACILITY:  [facility]  PHYSICIAN:  Vania Rea. Jarel Cuadra, M.D.  DATE OF BIRTH:  [birth year]  DATE OF PROCEDURE:  11/08/2014 DATE OF DISCHARGE:                              OPERATIVE REPORT   PREOPERATIVE DIAGNOSES: 1. Left shoulder rotator cuff tear. 2. Left shoulder impingement. 3. Left shoulder acromioclavicular joint arthropathy.  POSTOPERATIVE DIAGNOSES: 1. Left shoulder rotator cuff tear. 2. Left shoulder impingement. 3. Left shoulder acromioclavicular joint arthropathy.  PROCEDURE: 1. Left shoulder examination under anesthesia. 2. Left shoulder glenoid joint diagnostic arthroscopy. 3. Arthroscopic subacromial decompression and bursectomy. 4. Arthroscopic distal clavicle resection. 5. Arthroscopic rotator cuff repair using a double-row suture bridge     repair construct.  SURGEON:  Vania Rea. Besan Ketchem, MD  ASSISTANT:  Lucita Lora. Shuford, PA-C  ANESTHESIA:  General endotracheal as well as interscalene block.  ESTIMATED BLOOD LOSS:  Minimal.  DRAINS:  None.  HISTORY:  Ms. Friend is a 44 year old female who has had persistent left shoulder pain after a fall where she injured her left shoulder with immediate complaints of pain, weakness, loss of mobility, examination showing the guarded range of motion with profound weakness.  An MRI scan was obtained showing a full-thickness and moderately retracted tear of the rotator cuff as well as the degenerative changes.  Due to her ongoing pain and functional limitations, weakness, she was brought to the operating room at this time for planned left shoulder arthroscopy as described below.  Preoperatively, I counseled [name] on treatment options as well as risks versus benefits thereof.  Possible surgical complications were all reviewed including potential for bleeding, infection, neurovascular injury,  persistent pain, loss of motion, anesthetic complication, recurrence of rotator cuff tear and possible need for additional surgery.  She understands and accepts and agrees with the planned procedure.  PROCEDURE IN DETAIL:  After undergoing routine preop evaluation, the patient received prophylactic antibiotics.  An interscalene block was established in the holding area by the Anesthesia Department.  Placed supine on the operating table, underwent smooth induction of a general endotracheal anesthesia.  Turned to the right lateral decubitus position on a beanbag and appropriately padded and protected.  Left shoulder examination under anesthesia revealed full motion.  No instability patterns were noted.  Left arm was then suspended at 70 degrees of abduction with 15 pounds of traction and left shoulder girdle region was sterilely prepped and draped in standard fashion.  Time-out was called. Posterior portal site was established in the glenohumeral joint and anterior portal was established under direct visualization.  Articular surfaces were all found to be in excellent condition.  No instability patterns noted.  Biceps anchor was stable.  Biceps tendon normal caliber with no obvious distal instability.  There was an obvious full-thickness defect of the rotator cuff which was debrided from the articular side with the shaver.  We then completed the inspection of the glenohumeral joint.  No additional pathologies were identified.  Fluid was removed. The arm was then dropped down to 30 degrees of abduction with the arthroscope introduced into the subacromial space of the posterior portal and a  direct lateral portal established in the subacromial space. Abundant dense bursal tissue multiple adhesions were encountered and these were all divided and excised from the shaver and Stryker wand. The wand was then used to remove the periosteum from the undersurface of the anterior half of the acromion.   A subacromial decompression was performed with a bur creating a type 1 morphology.  Portal was then established directly into the distal clavicle and the distal clavicle resection was performed with a bur.  Care was taken to confirm visualization of the entire circumference of the distal clavicle to ensure adequate removal of bone.  We then completed a subacromial/subdeltoid bursectomy.  The rotator cuff tear was then readily identified and the shaver was used to debride the torn margin of the rotator to get back healthy tissue.  The footprint on the tuberosity was then prepared removing soft tissue and then bringing the bone to bleeding bed.  Through a stab wound, left lateral margin of the acromion was placed in the Arthrex, SwiveLock PEEK suture anchor with a fiber tape and these suture limbs were then shuttled, equidistant across the width of the rotator cuff tear and then using the Scorpion Suture passer, and then a lateral row anchor was placed again using a PEEK SwiveLock suture anchor.  The suture limbs were then clipped and the pattern of the tear did show an additional fold of tissue at the center aspect of the rotator cuff tear which was somewhat prominent and so we used the #2 FiberWire in the eyelet of the anchor and passed these through the "dog ear" portion of the center of rotator cuff tear with the sutures passed in a horizontal mattress and these were then tied with a sliding locking knot followed by multiple overhand throws and alternating posts.  This allowed excellent reapposition of the entirety of the rotator cuff tear nicely against the footprint on the tuberosity. The suture limbs were all clipped.  Hemostasis was obtained.  Fluid and instruments removed.  The portal was closed with Monocryl and Steri- Strips, dry dressing tape about the left shoulder, was placed in a sling and the patient was then awakened, extubated, and taken to the recovery room in stable  condition.  Tracy A. Shuford, PA-C was used as an Geophysicist/field seismologistassistant throughout this case, essential for help with positioning the patient, positioning of the extremity, management of the arthroscopic equipment, tissue manipulation, suture management and wound closure, and intraoperative decision making.     Vania ReaKevin M. Shalva Rozycki, M.D.     KMS/MEDQ  D:  11/08/2014  T:  11/08/2014  Job:  [id number]

## 2014-11-08 NOTE — Discharge Instructions (Signed)
° °  Kevin M. Supple, M.D., F.A.A.O.S. °Orthopaedic Surgery °Specializing in Arthroscopic and Reconstructive °Surgery of the Shoulder and Knee °[phone] °[address] - [place], [place] [address] - Fax [phone] ° °POST-OP SHOULDER ARTHROSCOPIC ROTATOR CUFF  REPAIR INSTRUCTIONS ° °1. Call the office at [phone] to schedule your first post-op appointment 7-10 days from the date of your surgery. ° °2. Leave the steri-strips in place over your incisions when performing dressing changes and showering. You may remove your dressings and begin showering 72 hours from surgery. You can expect drainage that is clear to bloody in nature that occasionally will soak through your dressings. If this occurs go ahead and perform a dressing change. The drainage should lessen daily and when there is no drainage from your incisions feel free to go without a dressing. ° °3. Wear your sling/immobilizer at all times except to perform the exercises below or to occasionally let your arm dangle by your side to stretch your elbow. You also need to sleep in your sling immobilizer until instructed otherwise. ° °4. Range of motion to your elbow, wrist, and hand are encouraged 3-5 times daily. Exercise to your hand and fingers helps to reduce swelling you may experience. ° °5. Utilize ice to the shoulder 3-4 times minimum a day and additionally if you are experiencing pain. ° °6. You may one-armed drive when safely off of narcotics and muscle relaxants. You may use your hand that is in the sling to support the steering wheel only. However, should it be your right arm that is in the sling it is not to be used for gear shifting in a manual transmission. ° °7. If you had a block pre-operatively to provide post-op pain relief you may want to go ahead and begin utilizing your pain meds as your arm begins to wake up. Blocks can sometimes last up to 16-18 hours. If you are still pain-free prior to going to bed you may want to  strongly consider taking a pain medication to avoid being awakened in the night with the onset of pain. A muscle relaxant is also provided for you should you experience muscle spasms. It is recommended that if you are experiencing pain that your pain medication alone is not controlling, add the muscle relaxant along with the pain medication which can give additional pain relief. The first one to two days is generally the most severe of your pain and then should gradually decrease. As your pain lessens it is recommended that you decrease your use of the pain medications to an "as needed basis" only and to always comply with the recommended dosages of the pain medications. ° °8. Pain medications can produce constipation along with their use. If you experience this, the use of an over the counter stool softener or laxative daily is recommended.  ° °9. For additional questions or concerns, please do not hesitate to call the office. If after hours there is an answering service to forward your concerns to the physician on call. ° °POST-OP EXERCISES ° °Pendulum Exercises ° °Perform pendulum exercises while standing and bending at the waist. Support your uninvolved arm on a table or chair and allow your operated arm to hang freely. Make sure to do these exercises passively - not using you shoulder muscle. ° °Repeat 20 times. Do 3 sessions per day. ° ° °

## 2014-11-08 NOTE — Op Note (Signed)
11/08/2014  9:14 AM  PATIENT:   [name]  44 y.o. female  PRE-OPERATIVE DIAGNOSIS:  LEFT SHOULDER ROTATOR CUFF TEAR, impingement, AC joint OA  POST-OPERATIVE DIAGNOSIS:  same  PROCEDURE:  LSA, SAD, DCR, RCR  SURGEON:  Edana Aguado, Vania ReaKevin M. M.D.  ASSISTANTS: Shuford pac   ANESTHESIA:   GET + ISB  EBL: min  SPECIMEN:  none  Drains: none   PATIENT DISPOSITION:  PACU - hemodynamically stable.    PLAN OF CARE: Discharge to home after PACU  Dictation# [id number]   Contact # [phone]

## 2014-11-09 ENCOUNTER — Encounter (HOSPITAL_COMMUNITY): Payer: Self-pay | Admitting: Orthopedic Surgery

## 2014-12-06 ENCOUNTER — Encounter (HOSPITAL_BASED_OUTPATIENT_CLINIC_OR_DEPARTMENT_OTHER): Payer: Self-pay | Admitting: *Deleted

## 2015-08-14 ENCOUNTER — Other Ambulatory Visit: Payer: Self-pay | Admitting: Family Medicine

## 2015-08-14 DIAGNOSIS — E010 Iodine-deficiency related diffuse (endemic) goiter: Secondary | ICD-10-CM

## 2015-08-15 ENCOUNTER — Other Ambulatory Visit: Payer: Self-pay

## 2015-08-20 ENCOUNTER — Other Ambulatory Visit: Payer: Self-pay

## 2015-08-27 ENCOUNTER — Ambulatory Visit (INDEPENDENT_AMBULATORY_CARE_PROVIDER_SITE_OTHER): Payer: Managed Care, Other (non HMO)

## 2015-08-27 DIAGNOSIS — E010 Iodine-deficiency related diffuse (endemic) goiter: Secondary | ICD-10-CM

## 2015-08-27 DIAGNOSIS — E0789 Other specified disorders of thyroid: Secondary | ICD-10-CM

## 2015-10-02 ENCOUNTER — Other Ambulatory Visit: Payer: Self-pay | Admitting: Orthopedic Surgery

## 2015-10-02 DIAGNOSIS — M5417 Radiculopathy, lumbosacral region: Secondary | ICD-10-CM

## 2015-10-10 ENCOUNTER — Other Ambulatory Visit: Payer: Self-pay | Admitting: Internal Medicine

## 2015-10-12 ENCOUNTER — Emergency Department (HOSPITAL_BASED_OUTPATIENT_CLINIC_OR_DEPARTMENT_OTHER)
Admission: EM | Admit: 2015-10-12 | Discharge: 2015-10-12 | Disposition: A | Discharge status: Home / Self Care | Payer: Managed Care, Other (non HMO) | Attending: Emergency Medicine | Admitting: Emergency Medicine

## 2015-10-12 ENCOUNTER — Encounter (HOSPITAL_BASED_OUTPATIENT_CLINIC_OR_DEPARTMENT_OTHER): Payer: Self-pay

## 2015-10-12 DIAGNOSIS — Z791 Long term (current) use of non-steroidal anti-inflammatories (NSAID): Secondary | ICD-10-CM | POA: Diagnosis not present

## 2015-10-12 DIAGNOSIS — Z8719 Personal history of other diseases of the digestive system: Secondary | ICD-10-CM | POA: Diagnosis not present

## 2015-10-12 DIAGNOSIS — Z8639 Personal history of other endocrine, nutritional and metabolic disease: Secondary | ICD-10-CM | POA: Insufficient documentation

## 2015-10-12 DIAGNOSIS — G43809 Other migraine, not intractable, without status migrainosus: Secondary | ICD-10-CM | POA: Diagnosis not present

## 2015-10-12 DIAGNOSIS — G8929 Other chronic pain: Secondary | ICD-10-CM | POA: Diagnosis not present

## 2015-10-12 DIAGNOSIS — R202 Paresthesia of skin: Secondary | ICD-10-CM | POA: Diagnosis not present

## 2015-10-12 DIAGNOSIS — F419 Anxiety disorder, unspecified: Secondary | ICD-10-CM | POA: Diagnosis not present

## 2015-10-12 DIAGNOSIS — Z862 Personal history of diseases of the blood and blood-forming organs and certain disorders involving the immune mechanism: Secondary | ICD-10-CM | POA: Insufficient documentation

## 2015-10-12 DIAGNOSIS — Z79899 Other long term (current) drug therapy: Secondary | ICD-10-CM | POA: Diagnosis not present

## 2015-10-12 DIAGNOSIS — R200 Anesthesia of skin: Secondary | ICD-10-CM | POA: Diagnosis present

## 2015-10-12 DIAGNOSIS — G43109 Migraine with aura, not intractable, without status migrainosus: Secondary | ICD-10-CM

## 2015-10-12 HISTORY — DX: Disorder of thyroid, unspecified: E07.9

## 2015-10-12 MED ORDER — DIPHENHYDRAMINE HCL 50 MG/ML IJ SOLN
25.0000 mg | Freq: Once | INTRAMUSCULAR | Status: AC
  Administered 2015-10-12: 25 mg via INTRAVENOUS
  Filled 2015-10-12: qty 1

## 2015-10-12 MED ORDER — DEXAMETHASONE SODIUM PHOSPHATE 10 MG/ML IJ SOLN
10.0000 mg | Freq: Once | INTRAMUSCULAR | Status: AC
  Administered 2015-10-12: 10 mg via INTRAVENOUS
  Filled 2015-10-12: qty 1

## 2015-10-12 MED ORDER — METOCLOPRAMIDE HCL 5 MG/ML IJ SOLN
10.0000 mg | Freq: Once | INTRAMUSCULAR | Status: AC
  Administered 2015-10-12: 10 mg via INTRAVENOUS
  Filled 2015-10-12: qty 2

## 2015-10-12 MED ORDER — SODIUM CHLORIDE 0.9 % IV BOLUS (SEPSIS)
1000.0000 mL | Freq: Once | INTRAVENOUS | Status: AC
  Administered 2015-10-12: 1000 mL via INTRAVENOUS

## 2015-10-12 NOTE — ED Provider Notes (Signed)
CSN: [id number]     Arrival date & time 10/12/15  1027 History   First MD Initiated Contact with Patient 10/12/15 [phone]     Chief Complaint  Patient presents with  . Numbness     (Consider location/radiation/quality/duration/timing/severity/associated sxs/prior Treatment) Patient is a 45 y.o. female presenting with neurologic complaint. The history is provided by the patient.  Neurologic Problem This is a new problem. Episode onset: 6+ hours ago. The problem occurs constantly. The problem has not changed since onset.Associated symptoms comments: "numbness" and warmth of tip of tongue, left mouth, tingling over the left side of her face, neck, chest, and upper arm stopping at elbow. . Nothing aggravates the symptoms. Nothing relieves the symptoms. She has tried nothing for the symptoms. The treatment provided no relief.    Past Medical History  Diagnosis Date  . Anxiety   . Chronic back pain   . Anemia   . Headache     migraines  . PONV (postoperative nausea and vomiting)   . Wears glasses   . GERD (gastroesophageal reflux disease)   . Thyroid disease     elevated thyroid, no meds currently as of 10/11/15   Past Surgical History  Procedure Laterality Date  . Cholecystectomy    . Ectopic pregnancy surgery    . Dilation and curettage of uterus    . Shoulder arthroscopy with subacromial decompression Left 11/08/2014    Procedure: LEFT SHOULDER ARTHROSCOPY SUBACROMIAL DECOMPRESSION AND DISTAL CLAVICAL RESECTION;  Surgeon: Francena Hanly, MD;  Location: [facility] OR;  Service: Orthopedics;  Laterality: Left;   Family History  Problem Relation Age of Onset  . Hypercholesterolemia Mother   . Diabetes Father   . Hypertension Father    Social History  Substance Use Topics  . Smoking status: Never Smoker   . Smokeless tobacco: Never Used  . Alcohol Use: Yes     Comment: rare   OB History    Gravida Para Term Preterm AB TAB SAB Ectopic Multiple Living   0 0 0 0 0 0 0 0       Review of  Systems  All other systems reviewed and are negative.     Allergies  Review of patient's allergies indicates no known allergies.  Home Medications   Prior to Admission medications   Medication Sig Start Date End Date Taking? Authorizing Provider  cyclobenzaprine (FLEXERIL) 10 MG tablet Take 1 tablet (10 mg total) by mouth 2 (two) times daily as needed for muscle spasms. 12/31/12   Kaitlyn Szekalski, PA-C  diazepam (VALIUM) 5 MG tablet Take 0.5-1 tablets (2.5-5 mg total) by mouth every 6 (six) hours as needed for muscle spasms or sedation. 11/08/14   French Ana Shuford, PA-C  docusate sodium (COLACE) 100 MG capsule Take 100 mg by mouth 2 (two) times daily as needed for mild constipation.    Historical Provider, MD  famotidine (PEPCID) 20 MG tablet Take 20 mg by mouth 2 (two) times daily as needed for heartburn or indigestion.    Historical Provider, MD  loratadine (CLARITIN) 10 MG tablet Take 10 mg by mouth daily.    Historical Provider, MD  naproxen (NAPROSYN) 500 MG tablet Take 500 mg by mouth 2 (two) times daily as needed for mild pain.    Historical Provider, MD  naproxen (NAPROSYN) 500 MG tablet Take 1 tablet (500 mg total) by mouth 2 (two) times daily with a meal. 11/08/14   French Ana Shuford, PA-C  Norethin-Eth Estrad-Fe Biphas (LO LOESTRIN FE PO) Take by mouth.  Historical Provider, MD  ondansetron (ZOFRAN) 4 MG tablet Take 1 tablet (4 mg total) by mouth every 8 (eight) hours as needed for nausea or vomiting. 11/08/14   Ralene Batheracy Shuford, PA-C  oxyCODONE-acetaminophen (PERCOCET) 5-325 MG per tablet Take 1-2 tablets by mouth every 4 (four) hours as needed. 11/08/14   French Anaracy Shuford, PA-C  sertraline (ZOLOFT) 100 MG tablet Take 100 mg by mouth daily.    Historical Provider, MD  sertraline (ZOLOFT) 100 MG tablet Take 100 mg by mouth daily.    Historical Provider, MD   BP 116/57 mmHg  Pulse 84  Temp(Src) 99 F (37.2 C) (Oral)  Resp 18  Ht 5\' 2"  (1.575 m)  Wt 250 lb (113.399 kg)  BMI 45.71 kg/m2   SpO2 99%  LMP 09/30/2015 Physical Exam  Constitutional: She is oriented to person, place, and time. She appears well-developed and well-nourished. No distress.  HENT:  Head: Normocephalic.  Eyes: Conjunctivae and EOM are normal. Pupils are equal, round, and reactive to light.  Neck: Neck supple. No tracheal deviation present.  Cardiovascular: Normal rate and regular rhythm.   Pulmonary/Chest: Effort normal. No respiratory distress.  Abdominal: Soft. She exhibits no distension.  Neurological: She is alert and oriented to person, place, and time. She has normal strength. No cranial nerve deficit or sensory deficit (normal V1-V3 distribution). Coordination normal. GCS eye subscore is 4. GCS verbal subscore is 5. GCS motor subscore is 6.  Normal finger to nose testing and rapid alternating movement, normal heel-to-shin  Skin: Skin is warm and dry.  Psychiatric: She has a normal mood and affect.  Vitals reviewed.   ED Course  Procedures (including critical care time) Labs Review Labs Reviewed - No data to display  Imaging Review No results found. I have personally reviewed and evaluated these images and lab results as part of my medical decision-making.   EKG Interpretation None      MDM   Final diagnoses:  Paresthesia  Complicated migraine    45 y.o. female presents with Paresthesia over a nonneurologic distribution involving the left side of her mouth, the tip of her tongue, her left forehead, her left upper chest and left upper arm extending only to the elbow. Paresthesia appears to be migrating in concert with an ongoing headache concerning for complex migraine symptoms. Patient normally takes naproxen for her migraines. No neurologic deficits here and otherwise well-appearing. I recommended treatment empirically with a migraine cocktail to help with symptoms which would likely resolve spontaneously.  Symptoms vastly improved following migraine cocktail. Plan to follow up with  PCP as needed and return precautions discussed for worsening or new concerning symptoms.   Lyndal Pulleyaniel Kimbrely Buckel, MD 10/12/15 [phone]

## 2015-10-12 NOTE — Discharge Instructions (Signed)
Paresthesia Paresthesia is an abnormal burning or prickling sensation. This sensation is generally felt in the hands, arms, legs, or feet. However, it may occur in any part of the body. Usually, it is not painful. The feeling may be described as:  Tingling or numbness.  Pins and needles.  Skin crawling.  Buzzing.  Limbs falling asleep.  Itching. Most people experience temporary (transient) paresthesia at some time in their lives. Paresthesia may occur when you breathe too quickly (hyperventilation). It can also occur without any apparent cause. Commonly, paresthesia occurs when pressure is placed on a nerve. The sensation quickly goes away after the pressure is removed. For some people, however, paresthesia is a long-lasting (chronic) condition that is caused by an underlying disorder. If you continue to have paresthesia, you may need further medical evaluation. HOME CARE INSTRUCTIONS Watch your condition for any changes. Taking the following actions may help to lessen any discomfort that you are feeling:  Avoid drinking alcohol.  Try acupuncture or massage to help relieve your symptoms.  Keep all follow-up visits as directed by your health care provider. This is important. SEEK MEDICAL CARE IF:  You continue to have episodes of paresthesia.  Your burning or prickling feeling gets worse when you walk.  You have pain, cramps, or dizziness.  You develop a rash. SEEK IMMEDIATE MEDICAL CARE IF:  You feel weak.  You have trouble walking or moving.  You have problems with speech, understanding, or vision.  You feel confused.  You cannot control your bladder or bowel movements.  You have numbness after an injury.  You faint.   This information is not intended to replace advice given to you by your health care provider. Make sure you discuss any questions you have with your health care provider.   Document Released: 06/26/2002 Document Revised: 11/20/2014 Document Reviewed:  07/02/2014 Elsevier Interactive Patient Education 2016 [employer]  

## 2015-10-12 NOTE — ED Notes (Signed)
Pt c/o left side facial numbness since 0100 this morning.  She denies any headache, face is symmetric, no slurring of speech, equal hand grip strength and no arm drift.  She feels like the numbness is slowly progressing down left side of neck and shoulder.  Had a recent viral illness this week.

## 2015-10-12 NOTE — ED Notes (Signed)
Pt states awoke this am with left sided facial numbness, radiates to left side of neck and left arm

## 2015-10-15 ENCOUNTER — Ambulatory Visit
Admission: RE | Admit: 2015-10-15 | Discharge: 2015-10-15 | Disposition: A | Discharge status: Home / Self Care | Payer: Managed Care, Other (non HMO) | Source: Ambulatory Visit | Attending: Orthopedic Surgery | Admitting: Orthopedic Surgery

## 2015-10-15 DIAGNOSIS — M5417 Radiculopathy, lumbosacral region: Secondary | ICD-10-CM

## 2015-10-15 MED ORDER — METHYLPREDNISOLONE ACETATE 40 MG/ML INJ SUSP (RADIOLOG
120.0000 mg | Freq: Once | INTRAMUSCULAR | Status: AC
  Administered 2015-10-15: 120 mg via EPIDURAL

## 2015-10-15 MED ORDER — IOHEXOL 180 MG/ML  SOLN
1.0000 mL | Freq: Once | INTRAMUSCULAR | Status: AC | PRN
  Administered 2015-10-15: 1 mL via EPIDURAL

## 2015-10-15 NOTE — Discharge Instructions (Signed)

## 2016-07-30 DIAGNOSIS — M5440 Lumbago with sciatica, unspecified side: Secondary | ICD-10-CM | POA: Diagnosis not present

## 2016-07-30 DIAGNOSIS — G8929 Other chronic pain: Secondary | ICD-10-CM | POA: Diagnosis not present

## 2016-07-30 DIAGNOSIS — E039 Hypothyroidism, unspecified: Secondary | ICD-10-CM | POA: Diagnosis not present

## 2016-07-30 DIAGNOSIS — F419 Anxiety disorder, unspecified: Secondary | ICD-10-CM | POA: Diagnosis not present

## 2016-10-27 DIAGNOSIS — F419 Anxiety disorder, unspecified: Secondary | ICD-10-CM | POA: Diagnosis not present

## 2016-10-27 DIAGNOSIS — M5440 Lumbago with sciatica, unspecified side: Secondary | ICD-10-CM | POA: Diagnosis not present

## 2016-10-27 DIAGNOSIS — M549 Dorsalgia, unspecified: Secondary | ICD-10-CM | POA: Diagnosis not present

## 2016-12-04 ENCOUNTER — Other Ambulatory Visit (HOSPITAL_BASED_OUTPATIENT_CLINIC_OR_DEPARTMENT_OTHER): Payer: Self-pay | Admitting: Internal Medicine

## 2016-12-04 ENCOUNTER — Ambulatory Visit (HOSPITAL_BASED_OUTPATIENT_CLINIC_OR_DEPARTMENT_OTHER)
Admission: RE | Admit: 2016-12-04 | Discharge: 2016-12-04 | Disposition: A | Discharge status: Home / Self Care | Payer: BLUE CROSS/BLUE SHIELD | Source: Ambulatory Visit | Attending: Internal Medicine | Admitting: Internal Medicine

## 2016-12-04 ENCOUNTER — Other Ambulatory Visit (HOSPITAL_BASED_OUTPATIENT_CLINIC_OR_DEPARTMENT_OTHER): Payer: Self-pay

## 2016-12-04 DIAGNOSIS — R1032 Left lower quadrant pain: Secondary | ICD-10-CM | POA: Diagnosis not present

## 2016-12-04 DIAGNOSIS — R938 Abnormal findings on diagnostic imaging of other specified body structures: Secondary | ICD-10-CM | POA: Insufficient documentation

## 2016-12-09 ENCOUNTER — Other Ambulatory Visit: Payer: Self-pay | Admitting: Internal Medicine

## 2016-12-09 ENCOUNTER — Other Ambulatory Visit (HOSPITAL_COMMUNITY)
Admission: RE | Admit: 2016-12-09 | Discharge: 2016-12-09 | Disposition: A | Discharge status: Home / Self Care | Payer: BLUE CROSS/BLUE SHIELD | Source: Ambulatory Visit | Attending: Obstetrics & Gynecology | Admitting: Obstetrics & Gynecology

## 2016-12-09 ENCOUNTER — Other Ambulatory Visit: Payer: Self-pay | Admitting: Obstetrics & Gynecology

## 2016-12-09 DIAGNOSIS — N840 Polyp of corpus uteri: Secondary | ICD-10-CM | POA: Diagnosis not present

## 2016-12-09 DIAGNOSIS — R87610 Atypical squamous cells of undetermined significance on cytologic smear of cervix (ASC-US): Secondary | ICD-10-CM | POA: Diagnosis not present

## 2016-12-09 DIAGNOSIS — Z1151 Encounter for screening for human papillomavirus (HPV): Secondary | ICD-10-CM | POA: Diagnosis not present

## 2016-12-09 DIAGNOSIS — Z01411 Encounter for gynecological examination (general) (routine) with abnormal findings: Secondary | ICD-10-CM | POA: Insufficient documentation

## 2016-12-09 DIAGNOSIS — N939 Abnormal uterine and vaginal bleeding, unspecified: Secondary | ICD-10-CM | POA: Diagnosis not present

## 2016-12-09 DIAGNOSIS — R1032 Left lower quadrant pain: Secondary | ICD-10-CM

## 2016-12-09 DIAGNOSIS — Z124 Encounter for screening for malignant neoplasm of cervix: Secondary | ICD-10-CM | POA: Diagnosis not present

## 2016-12-17 LAB — CYTOLOGY - PAP
DIAGNOSIS: UNDETERMINED — AB
HPV: NOT DETECTED

## 2017-01-26 DIAGNOSIS — G43919 Migraine, unspecified, intractable, without status migrainosus: Secondary | ICD-10-CM | POA: Diagnosis not present

## 2017-01-26 DIAGNOSIS — M5440 Lumbago with sciatica, unspecified side: Secondary | ICD-10-CM | POA: Diagnosis not present

## 2017-01-26 DIAGNOSIS — G894 Chronic pain syndrome: Secondary | ICD-10-CM | POA: Diagnosis not present

## 2017-02-10 ENCOUNTER — Ambulatory Visit (HOSPITAL_BASED_OUTPATIENT_CLINIC_OR_DEPARTMENT_OTHER)
Admission: RE | Admit: 2017-02-10 | Discharge: 2017-02-10 | Disposition: A | Discharge status: Home / Self Care | Payer: BLUE CROSS/BLUE SHIELD | Source: Ambulatory Visit | Attending: Family Medicine | Admitting: Family Medicine

## 2017-02-10 ENCOUNTER — Encounter (HOSPITAL_BASED_OUTPATIENT_CLINIC_OR_DEPARTMENT_OTHER): Payer: Self-pay

## 2017-02-10 ENCOUNTER — Other Ambulatory Visit: Payer: Self-pay | Admitting: Family Medicine

## 2017-02-10 DIAGNOSIS — R509 Fever, unspecified: Secondary | ICD-10-CM | POA: Insufficient documentation

## 2017-02-10 DIAGNOSIS — R195 Other fecal abnormalities: Secondary | ICD-10-CM | POA: Diagnosis not present

## 2017-02-10 DIAGNOSIS — R109 Unspecified abdominal pain: Secondary | ICD-10-CM | POA: Diagnosis not present

## 2017-02-10 DIAGNOSIS — R1030 Lower abdominal pain, unspecified: Secondary | ICD-10-CM

## 2017-02-10 DIAGNOSIS — M519 Unspecified thoracic, thoracolumbar and lumbosacral intervertebral disc disorder: Secondary | ICD-10-CM | POA: Diagnosis not present

## 2017-02-10 DIAGNOSIS — M545 Low back pain: Secondary | ICD-10-CM | POA: Diagnosis not present

## 2017-02-10 DIAGNOSIS — R8290 Unspecified abnormal findings in urine: Secondary | ICD-10-CM | POA: Diagnosis not present

## 2017-02-10 MED ORDER — IOPAMIDOL (ISOVUE-300) INJECTION 61%
100.0000 mL | Freq: Once | INTRAVENOUS | Status: AC | PRN
  Administered 2017-02-10: 100 mL via INTRAVENOUS

## 2017-02-19 DIAGNOSIS — R319 Hematuria, unspecified: Secondary | ICD-10-CM | POA: Diagnosis not present

## 2017-03-04 DIAGNOSIS — R350 Frequency of micturition: Secondary | ICD-10-CM | POA: Diagnosis not present

## 2017-03-04 DIAGNOSIS — R1032 Left lower quadrant pain: Secondary | ICD-10-CM | POA: Diagnosis not present

## 2017-03-04 DIAGNOSIS — R7989 Other specified abnormal findings of blood chemistry: Secondary | ICD-10-CM | POA: Diagnosis not present

## 2017-03-10 ENCOUNTER — Emergency Department (HOSPITAL_BASED_OUTPATIENT_CLINIC_OR_DEPARTMENT_OTHER)
Admission: EM | Admit: 2017-03-10 | Discharge: 2017-03-10 | Disposition: A | Discharge status: Home / Self Care | Payer: BLUE CROSS/BLUE SHIELD | Attending: Emergency Medicine | Admitting: Emergency Medicine

## 2017-03-10 ENCOUNTER — Encounter (HOSPITAL_BASED_OUTPATIENT_CLINIC_OR_DEPARTMENT_OTHER): Payer: Self-pay | Admitting: Respiratory Therapy

## 2017-03-10 DIAGNOSIS — M546 Pain in thoracic spine: Secondary | ICD-10-CM

## 2017-03-10 DIAGNOSIS — R0789 Other chest pain: Secondary | ICD-10-CM | POA: Diagnosis not present

## 2017-03-10 DIAGNOSIS — Z79899 Other long term (current) drug therapy: Secondary | ICD-10-CM | POA: Diagnosis not present

## 2017-03-10 DIAGNOSIS — M549 Dorsalgia, unspecified: Secondary | ICD-10-CM | POA: Diagnosis present

## 2017-03-10 MED ORDER — KETOROLAC TROMETHAMINE 60 MG/2ML IM SOLN
15.0000 mg | Freq: Once | INTRAMUSCULAR | Status: DC
Start: 2017-03-10 — End: 2017-03-10
  Filled 2017-03-10: qty 2

## 2017-03-10 MED ORDER — ACETAMINOPHEN 500 MG PO TABS
1000.0000 mg | ORAL_TABLET | Freq: Once | ORAL | Status: DC
  Filled 2017-03-10: qty 2

## 2017-03-10 NOTE — Discharge Instructions (Signed)
Take 4 over the counter ibuprofen tablets 3 times a day or 2 over-the-counter naproxen tablets twice a day for pain. Also take tylenol 1000mg(2 extra strength) four times a day.    

## 2017-03-10 NOTE — ED Triage Notes (Signed)
Pt presents with R mid-lower back pain radiating to R shoulder. Reports worsening with movement and deep breathing. Denies fever, v/d. Reports nausea but states that's her norm. Reports taking Zanaflex and Vicoprofen PTA.

## 2017-03-10 NOTE — ED Notes (Signed)
Pt refused pain medication and arm sling -- states she doesn't believe this is a musculoskeletal issue. Offered to have EDP come in to speak with her again and pt declined this offer twice. States she plans to call her PCP.

## 2017-03-10 NOTE — ED Provider Notes (Signed)
[facility] Provider Note   CSN: [id number] Arrival date & time: 03/10/17  0759     History   Chief Complaint Chief Complaint  Patient presents with  . Back Pain    HPI [name] is a 46 y.o. female.  46 yo F with a chief complaint of right-sided chest pain. Going on since yesterday. Denies any traumatic injury. Went to an open house for her eighth grader. Denies fevers or chills denies cough or congestion. Denies lower extremity edema hemoptysis estrogen use recent surgery or travel history of cancer or prior PE or DVT. Denies vomiting or diarrhea. Pain is worse with movement palpation twisting. Also worse with positioning and deep breaths. Sharp and shooting. 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 Denies radiation of the pain.   The history is provided by the patient.  Illness  This is a new problem. The current episode started yesterday. The problem occurs constantly. The problem has been gradually worsening. Associated symptoms include chest pain and shortness of breath. Pertinent negatives include no headaches. The symptoms are aggravated by bending and twisting. Nothing relieves the symptoms. She has tried nothing for the symptoms. The treatment provided no relief.    Past Medical History:  Diagnosis Date  . Anemia   . Anxiety   . Chronic back pain   . GERD (gastroesophageal reflux disease)   . Headache    migraines  . PONV (postoperative nausea and vomiting)   . Thyroid disease    elevated thyroid, no meds currently as of 10/11/15  . Wears glasses     There are no active problems to display for this patient.   Past Surgical History:  Procedure Laterality Date  . CHOLECYSTECTOMY    . DILATION AND CURETTAGE OF UTERUS    . ECTOPIC PREGNANCY SURGERY    . SHOULDER ARTHROSCOPY WITH SUBACROMIAL DECOMPRESSION Left 11/08/2014   Procedure: LEFT SHOULDER ARTHROSCOPY SUBACROMIAL DECOMPRESSION AND DISTAL CLAVICAL RESECTION;  Surgeon:  Francena Hanly, MD;  Location: [facility] OR;  Service: Orthopedics;  Laterality: Left;    OB History    Gravida Para Term Preterm AB Living   0 0 0 0 0     SAB TAB Ectopic Multiple Live Births   0 0 0           Home Medications    Prior to Admission medications   Medication Sig Start Date End Date Taking? Authorizing Provider  Acetaminophen-Codeine (TYLENOL WITH CODEINE #3 PO) Take by mouth.   Yes [provider]  HYDROcodone-ibuprofen (VICOPROFEN) 7.5-200 MG tablet Take 1 tablet by mouth every 8 (eight) hours as needed for moderate pain.   Yes [provider]  levothyroxine (SYNTHROID, LEVOTHROID) 75 MCG tablet Take 75 mcg by mouth daily before breakfast.   Yes [provider]  naproxen (NAPROSYN) 500 MG tablet Take 1 tablet (500 mg total) by mouth 2 (two) times daily with a meal. 11/08/14  Yes Shuford, French Ana, PA-C  ondansetron (ZOFRAN) 4 MG tablet Take 1 tablet (4 mg total) by mouth every 8 (eight) hours as needed for nausea or vomiting. 11/08/14  Yes Shuford, French Ana, PA-C  sertraline (ZOLOFT) 100 MG tablet Take 100 mg by mouth daily.   Yes [provider]  tamsulosin (FLOMAX) 0.4 MG CAPS capsule Take 0.4 mg by mouth.   Yes [provider]  TiZANidine HCl (ZANAFLEX PO) Take by mouth.   Yes [provider]  cyclobenzaprine (FLEXERIL) 10 MG tablet Take 1 tablet (  10 mg total) by mouth 2 (two) times daily as needed for muscle spasms. 12/31/12   Szekalski, Kaitlyn, PA-C  diazepam (VALIUM) 5 MG tablet Take 0.5-1 tablets (2.5-5 mg total) by mouth every 6 (six) hours as needed for muscle spasms or sedation. 11/08/14   Shuford, French Ana, PA-C  docusate sodium (COLACE) 100 MG capsule Take 100 mg by mouth 2 (two) times daily as needed for mild constipation.    [provider]  famotidine (PEPCID) 20 MG tablet Take 20 mg by mouth 2 (two) times daily as needed for heartburn or indigestion.    [provider]  loratadine (CLARITIN) 10 MG tablet  Take 10 mg by mouth daily.    [provider]  naproxen (NAPROSYN) 500 MG tablet Take 500 mg by mouth 2 (two) times daily as needed for mild pain.    [provider]  Norethin-Eth Estrad-Fe Biphas (LO LOESTRIN FE PO) Take by mouth.    [provider]  oxyCODONE-acetaminophen (PERCOCET) 5-325 MG per tablet Take 1-2 tablets by mouth every 4 (four) hours as needed. 11/08/14   Shuford, French Ana, PA-C  sertraline (ZOLOFT) 100 MG tablet Take 100 mg by mouth daily.    [provider]    Family History Family History  Problem Relation Age of Onset  . Hypercholesterolemia Mother   . Diabetes Father   . Hypertension Father     Social History Social History  Substance Use Topics  . Smoking status: Never Smoker  . Smokeless tobacco: Never Used  . Alcohol use Yes     Comment: rare     Allergies   Patient has no known allergies.   Review of Systems Review of Systems  Constitutional: Negative for chills and fever.  HENT: Negative for congestion and rhinorrhea.   Eyes: Negative for redness and visual disturbance.  Respiratory: Positive for shortness of breath. Negative for wheezing.   Cardiovascular: Positive for chest pain. Negative for palpitations.  Gastrointestinal: Negative for nausea and vomiting.  Genitourinary: Negative for dysuria and urgency.  Musculoskeletal: Positive for arthralgias, back pain and myalgias.  Skin: Negative for pallor and wound.  Neurological: Negative for dizziness and headaches.     Physical Exam Updated Vital Signs BP 127/74 (BP Location: Right Arm)   Pulse (!) 102   Temp 98.4 F (36.9 C) (Oral)   Resp 16   Ht 5\' 2"  (1.575 m)   Wt 108.9 kg (240 lb)   LMP 02/21/2017   SpO2 100%   BMI 43.90 kg/m   Physical Exam  Constitutional: She is oriented to person, place, and time. She appears well-developed and well-nourished. No distress.  HENT:  Head: Normocephalic and atraumatic.  Eyes: Pupils are equal, round, and  reactive to light. EOM are normal.  Neck: Normal range of motion. Neck supple.  Cardiovascular: Normal rate and regular rhythm.  Exam reveals no gallop and no friction rub.   No murmur heard. Pulmonary/Chest: Effort normal. She has no wheezes. She has no rales. She exhibits tenderness.  Abdominal: Soft. She exhibits no distension. There is no tenderness.  Musculoskeletal: She exhibits tenderness. She exhibits no edema.  Tender palpation about the right anterior chest wall about ribs 2 and 3 along the midclavicular line. Pain also to the right posterior rib angles of ribs 7 through 9.  Neurological: She is alert and oriented to person, place, and time.  Skin: Skin is warm and dry. She is not diaphoretic.  Psychiatric: She has a normal mood and affect. Her behavior  is normal.  Nursing note and vitals reviewed.    ED Treatments / Results  Labs (all labs ordered are listed, but only abnormal results are displayed) Labs Reviewed - No data to display  EKG  EKG Interpretation None       Radiology No results found.  Procedures Procedures (including critical care time)  Medications Ordered in ED Medications  ketorolac (TORADOL) injection 15 mg (not administered)  acetaminophen (TYLENOL) tablet 1,000 mg (not administered)     Initial Impression / Assessment and Plan / ED Course  I have reviewed the triage vital signs and the nursing notes.  Pertinent labs & imaging results that were available during my care of the patient were reviewed by me and considered in my medical decision making (see chart for details).     46 yo F With a chief complaint of chest wall pain. Going on since yesterday. Reproduced on exam. Suspect musculoskeletal in nature. Feel this is unlikely to be a PE based on history and physical. Discharge home with Tylenol and NSAIDs. PCP follow up.   8:30 AM:  I have discussed the diagnosis/risks/treatment options with the patient and believe the pt to be eligible  for discharge home to follow-up with PCP. We also discussed returning to the ED immediately if new or worsening sx occur. We discussed the sx which are most concerning (e.g., sudden worsening pain, fever, inability to tolerate by mouth) that necessitate immediate return. Medications administered to the patient during their visit and any new prescriptions provided to the patient are listed below.  Medications given during this visit Medications  ketorolac (TORADOL) injection 15 mg (not administered)  acetaminophen (TYLENOL) tablet 1,000 mg (not administered)     The patient appears reasonably screen and/or stabilized for discharge and I doubt any other medical condition or other [facility] requiring further screening, evaluation, or treatment in the ED at this time prior to discharge.    Final Clinical Impressions(s) / ED Diagnoses   Final diagnoses:  Chest wall pain  Acute right-sided thoracic back pain    New Prescriptions New Prescriptions   No medications on file     Melene Plan, DO 03/10/17 0830

## 2017-03-11 ENCOUNTER — Emergency Department (HOSPITAL_BASED_OUTPATIENT_CLINIC_OR_DEPARTMENT_OTHER): Payer: BLUE CROSS/BLUE SHIELD

## 2017-03-11 ENCOUNTER — Emergency Department (HOSPITAL_BASED_OUTPATIENT_CLINIC_OR_DEPARTMENT_OTHER)
Admission: EM | Admit: 2017-03-11 | Discharge: 2017-03-11 | Disposition: A | Discharge status: Home / Self Care | Payer: BLUE CROSS/BLUE SHIELD | Attending: Emergency Medicine | Admitting: Emergency Medicine

## 2017-03-11 ENCOUNTER — Encounter (HOSPITAL_BASED_OUTPATIENT_CLINIC_OR_DEPARTMENT_OTHER): Payer: Self-pay | Admitting: *Deleted

## 2017-03-11 DIAGNOSIS — R071 Chest pain on breathing: Secondary | ICD-10-CM | POA: Diagnosis not present

## 2017-03-11 DIAGNOSIS — R079 Chest pain, unspecified: Secondary | ICD-10-CM | POA: Diagnosis not present

## 2017-03-11 DIAGNOSIS — M546 Pain in thoracic spine: Secondary | ICD-10-CM

## 2017-03-11 DIAGNOSIS — R0602 Shortness of breath: Secondary | ICD-10-CM | POA: Diagnosis not present

## 2017-03-11 DIAGNOSIS — Z79899 Other long term (current) drug therapy: Secondary | ICD-10-CM | POA: Diagnosis not present

## 2017-03-11 DIAGNOSIS — M549 Dorsalgia, unspecified: Secondary | ICD-10-CM | POA: Diagnosis not present

## 2017-03-11 DIAGNOSIS — R791 Abnormal coagulation profile: Secondary | ICD-10-CM | POA: Diagnosis not present

## 2017-03-11 DIAGNOSIS — M7918 Myalgia, other site: Secondary | ICD-10-CM

## 2017-03-11 LAB — TROPONIN I: Troponin I: <0.03 ng/mL (ref <0.03)

## 2017-03-11 MED ORDER — IOPAMIDOL (ISOVUE-370) INJECTION 76%
100.0000 mL | Freq: Once | INTRAVENOUS | Status: AC | PRN
  Administered 2017-03-11: 100 mL via INTRAVENOUS

## 2017-03-11 NOTE — ED Triage Notes (Signed)
She was examined by her MD today and had blood drawn for Ddimer. She was called at home and told to come here for elevated Ddimer.

## 2017-03-11 NOTE — Discharge Instructions (Signed)
Continue your anti-inflammatory, pain medicine, and muscle relaxants at home. Follow up with your primary care physician.

## 2017-03-11 NOTE — ED Provider Notes (Signed)
[facility]   CSN: [id number] Arrival date & time: 03/11/17  1835     History   Chief Complaint No chief complaint on file.   HPI Zilpha Schoenfelder is a 46 y.o. female. Vision presents for evaluation of right lower chest and back pain.  HPI patient reports a 3 to four-day history of right lateral lower chest and right back pain. Hurts to move hurts to bend her to touch first a cough. Seen and evaluated here diagnosed must go skeletal pain. Seen by her physician today. Had an x-ray that was normal. Had an EKG and troponin that was normal. Had a d-dimer that was elevated.  I received a call from her physician that she was referring her here for "a CAT scan".  Patient has no history of DVT or PE. No family history of hypercoagulable state. She had a 6 hour car ride recently been no other prolonged immobilization. She is a nonsmoker. No recent procedures, cast, splints, fractures.   Past Medical History:  Diagnosis Date  . Anemia   . Anxiety   . Chronic back pain   . GERD (gastroesophageal reflux disease)   . Headache    migraines  . PONV (postoperative nausea and vomiting)   . Thyroid disease    elevated thyroid, no meds currently as of 10/11/15  . Wears glasses     There are no active problems to display for this patient.   Past Surgical History:  Procedure Laterality Date  . CHOLECYSTECTOMY    . DILATION AND CURETTAGE OF UTERUS    . ECTOPIC PREGNANCY SURGERY    . SHOULDER ARTHROSCOPY WITH SUBACROMIAL DECOMPRESSION Left 11/08/2014   Procedure: LEFT SHOULDER ARTHROSCOPY SUBACROMIAL DECOMPRESSION AND DISTAL CLAVICAL RESECTION;  Surgeon: Francena Hanly, MD;  Location: [facility] OR;  Service: Orthopedics;  Laterality: Left;    OB History    Gravida Para Term Preterm AB Living   0 0 0 0 0     SAB TAB Ectopic Multiple Live Births   0 0 0           Home Medications    Prior to Admission medications   Medication Sig Start Date End Date Taking?  Authorizing Provider  Acetaminophen-Codeine (TYLENOL WITH CODEINE #3 PO) Take by mouth.    [provider]  cyclobenzaprine (FLEXERIL) 10 MG tablet Take 1 tablet (10 mg total) by mouth 2 (two) times daily as needed for muscle spasms. 12/31/12   Szekalski, Kaitlyn, PA-C  diazepam (VALIUM) 5 MG tablet Take 0.5-1 tablets (2.5-5 mg total) by mouth every 6 (six) hours as needed for muscle spasms or sedation. 11/08/14   Shuford, French Ana, PA-C  docusate sodium (COLACE) 100 MG capsule Take 100 mg by mouth 2 (two) times daily as needed for mild constipation.    [provider]  famotidine (PEPCID) 20 MG tablet Take 20 mg by mouth 2 (two) times daily as needed for heartburn or indigestion.    [provider]  HYDROcodone-ibuprofen (VICOPROFEN) 7.5-200 MG tablet Take 1 tablet by mouth every 8 (eight) hours as needed for moderate pain.    [provider]  levothyroxine (SYNTHROID, LEVOTHROID) 75 MCG tablet Take 75 mcg by mouth daily before breakfast.    [provider]  loratadine (CLARITIN) 10 MG tablet Take 10 mg by mouth daily.    [provider]  naproxen (NAPROSYN) 500 MG tablet Take 500 mg by mouth 2 (two) times daily as needed for mild pain.    [provider]  naproxen (NAPROSYN) 500 MG tablet Take 1 tablet (500 mg total) by mouth 2 (two) times daily with a meal. 11/08/14   Shuford, French Ana, PA-C  Norethin-Eth Estrad-Fe Biphas (LO LOESTRIN FE PO) Take by mouth.    [provider]  ondansetron (ZOFRAN) 4 MG tablet Take 1 tablet (4 mg total) by mouth every 8 (eight) hours as needed for nausea or vomiting. 11/08/14   Shuford, French Ana, PA-C  oxyCODONE-acetaminophen (PERCOCET) 5-325 MG per tablet Take 1-2 tablets by mouth every 4 (four) hours as needed. 11/08/14   Shuford, French Ana, PA-C  sertraline (ZOLOFT) 100 MG tablet Take 100 mg by mouth daily.    [provider]  sertraline (ZOLOFT) 100 MG tablet Take 100 mg by mouth daily.    [provider]  tamsulosin (FLOMAX) 0.4 MG CAPS capsule Take 0.4 mg by mouth.    [provider]  TiZANidine HCl (ZANAFLEX PO) Take by mouth.    [provider]    Family History Family History  Problem Relation Age of Onset  . Hypercholesterolemia Mother   . Diabetes Father   . Hypertension Father     Social History Social History  Substance Use Topics  . Smoking status: Never Smoker  . Smokeless tobacco: Never Used  . Alcohol use Yes     Comment: rare     Allergies   Patient has no known allergies.   Review of Systems Review of Systems  Constitutional: Negative for appetite change, chills, diaphoresis, fatigue and fever.  HENT: Negative for mouth sores, sore throat and trouble swallowing.   Eyes: Negative for visual disturbance.  Respiratory: Negative for cough, chest tightness, shortness of breath and wheezing.   Cardiovascular: Negative for chest pain.  Gastrointestinal: Negative for abdominal distention, abdominal pain, diarrhea, nausea and vomiting.  Endocrine: Negative for polydipsia, polyphagia and polyuria.  Genitourinary: Negative for dysuria, frequency and hematuria.  Musculoskeletal: Positive for back pain. Negative for gait problem.  Skin: Negative for color change, pallor and rash.  Neurological: Negative for dizziness, syncope, light-headedness and headaches.  Hematological: Does not bruise/bleed easily.  Psychiatric/Behavioral: Negative for behavioral problems and confusion.     Physical Exam Updated Vital Signs BP 120/79 (BP Location: Right Arm)   Pulse 92   Temp 98.9 F (37.2 C) (Oral)   Resp 19   Ht 5\' 2"  (1.575 m)   Wt 108.9 kg (240 lb)   LMP 02/21/2017   SpO2 96%   BMI 43.90 kg/m   Physical Exam  Constitutional: She is oriented to person, place, and time. She appears well-developed and well-nourished. No distress.  HENT:  Head: Normocephalic.  Eyes: Pupils are equal, round, and reactive to light. Conjunctivae are  normal. No scleral icterus.  Neck: Normal range of motion. Neck supple. No thyromegaly present.  Cardiovascular: Normal rate and regular rhythm.  Exam reveals no gallop and no friction rub.   No murmur heard. Pulmonary/Chest: Effort normal and breath sounds normal. No respiratory distress. She has no wheezes. She has no rales.  Tenderness in the right lower back musculature. Clear bilateral breath sounds. Not tachypneic or tachycardic.  Abdominal: Soft. Bowel sounds are normal. She exhibits no distension. There is no tenderness. There is no rebound.  Musculoskeletal: Normal range of motion.  Neurological: She is alert and oriented to person, place, and time.  Skin: Skin is warm and dry. No rash noted.  Psychiatric: She has a normal mood and affect. Her behavior is normal.  ED Treatments / Results  Labs (all labs ordered are listed, but only abnormal results are displayed) Labs Reviewed  TROPONIN I    EKG  EKG Interpretation None       Radiology Ct Angio Chest Pe W And/or Wo Contrast  Result Date: 03/11/2017 CLINICAL DATA:  Chest pain. EXAM: CT ANGIOGRAPHY CHEST WITH CONTRAST TECHNIQUE: Multidetector CT imaging of the chest was performed using the standard protocol during bolus administration of intravenous contrast. Multiplanar CT image reconstructions and MIPs were obtained to evaluate the vascular anatomy. CONTRAST:  100 mL of Isovue 370 intravenously. COMPARISON:  CT scan of Dec 06, 2007. FINDINGS: Cardiovascular: There is no evidence of thoracic aortic dissection or aneurysm. There is no definite evidence of large central pulmonary embolus. However, due to soft tissue attenuation secondary to body habitus as well as some degree of respiratory motion artifact, smaller peripheral pulmonary emboli cannot be ruled out on the lower lobe branches of the pulmonary arteries. No pericardial effusion is noted. Mediastinum/Nodes: No enlarged mediastinal, hilar, or axillary lymph nodes.  Thyroid gland, trachea, and esophagus demonstrate no significant findings. Lungs/Pleura: No pneumothorax is noted. Minimal bilateral pleural effusions are noted. No consolidative process is noted. Upper Abdomen: No acute abnormality. Musculoskeletal: No chest wall abnormality. No acute or significant osseous findings. Review of the MIP images confirms the above findings. IMPRESSION: No definite evidence of large central pulmonary embolus. However, due to soft tissue attenuation secondary to body habitus as well some degree of respiratory motion artifact, smaller peripheral pulmonary emboli cannot be excluded in the lower lobe branches of the pulmonary arteries on the basis of this study. Minimal bilateral pleural effusions are noted. No other abnormality seen in the chest. Electronically Signed   By: Lupita Raider, M.D.   On: 03/11/2017 20:23   US Venous Img Lower Bilateral  Result Date: 03/11/2017 CLINICAL DATA:  Chest pain. EXAM: BILATERAL LOWER EXTREMITY VENOUS DOPPLER ULTRASOUND TECHNIQUE: Gray-scale sonography with graded compression, as well as color Doppler and duplex ultrasound were performed to evaluate the lower extremity deep venous systems from the level of the common femoral vein and including the common femoral, femoral, profunda femoral, popliteal and calf veins including the posterior tibial, peroneal and gastrocnemius veins when visible. The superficial great saphenous vein was also interrogated. Spectral Doppler was utilized to evaluate flow at rest and with distal augmentation maneuvers in the common femoral, femoral and popliteal veins. COMPARISON:  None. FINDINGS: RIGHT LOWER EXTREMITY Common Femoral Vein: No evidence of thrombus. Normal compressibility, respiratory phasicity and response to augmentation. Saphenofemoral Junction: No evidence of thrombus. Normal compressibility and flow on color Doppler imaging. Profunda Femoral Vein: No evidence of thrombus. Normal compressibility and flow  on color Doppler imaging. Femoral Vein: No evidence of thrombus. Normal compressibility, respiratory phasicity and response to augmentation. Popliteal Vein: No evidence of thrombus. Normal compressibility, respiratory phasicity and response to augmentation. Calf Veins: No evidence of thrombus. Normal compressibility and flow on color Doppler imaging. Peroneal veins not well visualized. Superficial Great Saphenous Vein: No evidence of thrombus. Normal compressibility and flow on color Doppler imaging. Venous Reflux:  None. Other Findings:  None. LEFT LOWER EXTREMITY Common Femoral Vein: No evidence of thrombus. Normal compressibility, respiratory phasicity and response to augmentation. Saphenofemoral Junction: No evidence of thrombus. Normal compressibility and flow on color Doppler imaging. Profunda Femoral Vein: No evidence of thrombus. Normal compressibility and flow on color Doppler imaging. Femoral Vein: No evidence of thrombus. Normal compressibility, respiratory phasicity and response to augmentation. Popliteal  Vein: No evidence of thrombus. Normal compressibility, respiratory phasicity and response to augmentation. Calf Veins: No evidence of thrombus. Normal compressibility and flow on color Doppler imaging. Peroneal veins not well visualized. Superficial Great Saphenous Vein: No evidence of thrombus. Normal compressibility and flow on color Doppler imaging. Venous Reflux:  None. Other Findings:  None. IMPRESSION: No evidence of DVT within either lower extremity. Electronically Signed   By: Lupita Raider, M.D.   On: 03/11/2017 22:05    Procedures Procedures (including critical care time)  Medications Ordered in ED Medications  iopamidol (ISOVUE-370) 76 % injection 100 mL (100 mLs Intravenous Contrast Given 03/11/17 1957)     Initial Impression / Assessment and Plan / ED Course  I have reviewed the triage vital signs and the nursing notes.  Pertinent labs & imaging results that were available  during my care of the patient were reviewed by me and considered in my medical decision making (see chart for details).   patient has a positive low specificity test but is otherwise low risk for DVT or PE. Her pretest probability is low. However she is concerned, and her primary care physician's concerned. CTA was obtained. This shows no central pulmonary emboli. Radiology cannot comment on peripheral small vasculature based on habitus.  Bilateral lower externally Dopplers were obtained and are negative. This coupled with reassuring scan and low pretest probability rules out clinically significant pulmonary embolism this patient. Her symptoms are consistent with musculoskeletal pain. She will continue anti-inflammatory pain medicines most relaxants and memory care follow-up.  Final Clinical Impressions(s) / ED Diagnoses   Final diagnoses:  Acute right-sided thoracic back pain  Musculoskeletal pain    New Prescriptions New Prescriptions   No medications on file     Rolland Porter, MD 03/11/17 2316

## 2017-03-11 NOTE — ED Notes (Signed)
ED Provider at bedside. 

## 2017-03-25 DIAGNOSIS — N951 Menopausal and female climacteric states: Secondary | ICD-10-CM | POA: Diagnosis not present

## 2017-03-25 DIAGNOSIS — R635 Abnormal weight gain: Secondary | ICD-10-CM | POA: Diagnosis not present

## 2017-04-20 DIAGNOSIS — E559 Vitamin D deficiency, unspecified: Secondary | ICD-10-CM | POA: Diagnosis not present

## 2017-04-20 DIAGNOSIS — E782 Mixed hyperlipidemia: Secondary | ICD-10-CM | POA: Diagnosis not present

## 2017-04-20 DIAGNOSIS — E039 Hypothyroidism, unspecified: Secondary | ICD-10-CM | POA: Diagnosis not present

## 2017-04-27 DIAGNOSIS — M5440 Lumbago with sciatica, unspecified side: Secondary | ICD-10-CM | POA: Diagnosis not present

## 2017-04-27 DIAGNOSIS — M549 Dorsalgia, unspecified: Secondary | ICD-10-CM | POA: Diagnosis not present

## 2017-04-27 DIAGNOSIS — F419 Anxiety disorder, unspecified: Secondary | ICD-10-CM | POA: Diagnosis not present

## 2017-05-04 DIAGNOSIS — E8881 Metabolic syndrome: Secondary | ICD-10-CM | POA: Diagnosis not present

## 2017-05-20 DIAGNOSIS — E559 Vitamin D deficiency, unspecified: Secondary | ICD-10-CM | POA: Diagnosis not present

## 2017-05-20 DIAGNOSIS — E782 Mixed hyperlipidemia: Secondary | ICD-10-CM | POA: Diagnosis not present

## 2017-05-20 DIAGNOSIS — E8881 Metabolic syndrome: Secondary | ICD-10-CM | POA: Diagnosis not present

## 2017-05-26 DIAGNOSIS — E8881 Metabolic syndrome: Secondary | ICD-10-CM | POA: Diagnosis not present

## 2017-05-26 DIAGNOSIS — D509 Iron deficiency anemia, unspecified: Secondary | ICD-10-CM | POA: Diagnosis not present

## 2017-05-26 DIAGNOSIS — E039 Hypothyroidism, unspecified: Secondary | ICD-10-CM | POA: Diagnosis not present

## 2017-07-27 DIAGNOSIS — M5440 Lumbago with sciatica, unspecified side: Secondary | ICD-10-CM | POA: Diagnosis not present

## 2017-07-27 DIAGNOSIS — R161 Splenomegaly, not elsewhere classified: Secondary | ICD-10-CM | POA: Diagnosis not present

## 2017-07-27 DIAGNOSIS — M549 Dorsalgia, unspecified: Secondary | ICD-10-CM | POA: Diagnosis not present

## 2017-10-19 IMAGING — CT CT ANGIO CHEST
2 of 8 series · 18 of 36 positions shown · IV contrast (isovue)
Comparison: CT scan of December 06, 2007.

CLINICAL DATA: Chest pain.

EXAM:
CT ANGIOGRAPHY CHEST WITH CONTRAST
TECHNIQUE: Multidetector CT imaging of the chest was performed using the
standard protocol during bolus administration of intravenous
contrast. Multiplanar CT image reconstructions and MIPs were
obtained to evaluate the vascular anatomy.
CONTRAST:  100 mL of Isovue 370 intravenously.

[Series 7: pe thins · axial · 0.73mm/px · z∈[-193,+24]mm · 17 of 243 slices shown]
[im 13/243  lung]
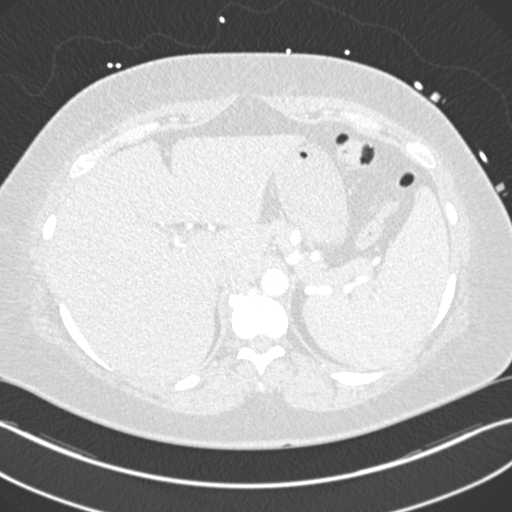
[im 26/243  mediastinal]
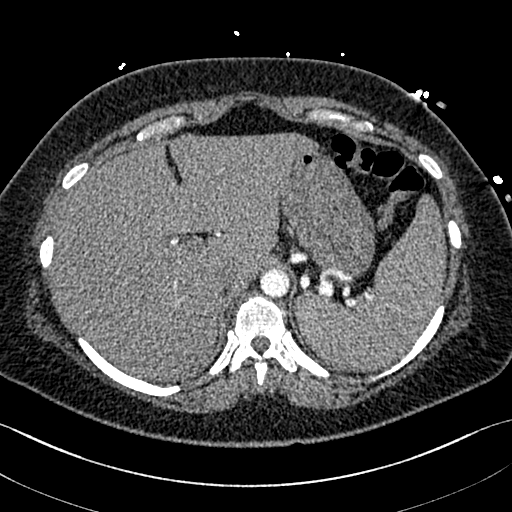
[im 39/243  lung]
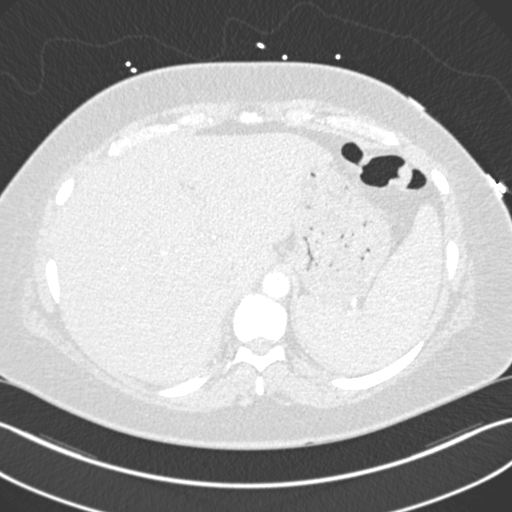
[im 51/243  mediastinal]
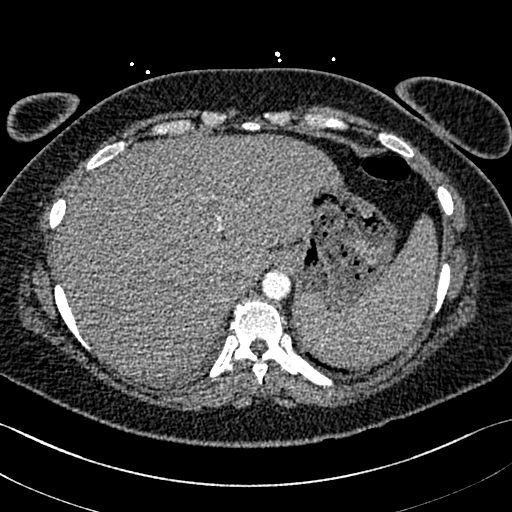
[im 64/243  lung]
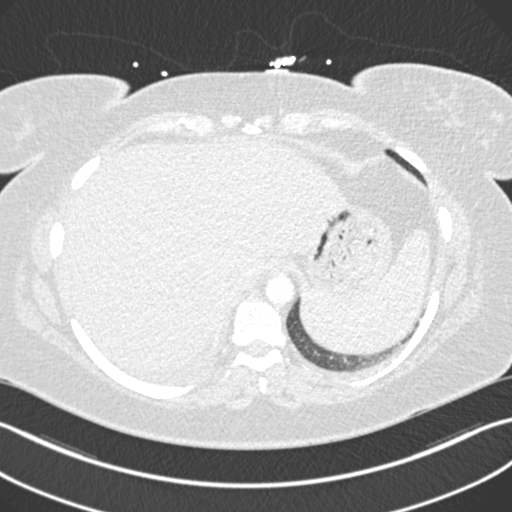
[im 77/243  mediastinal]
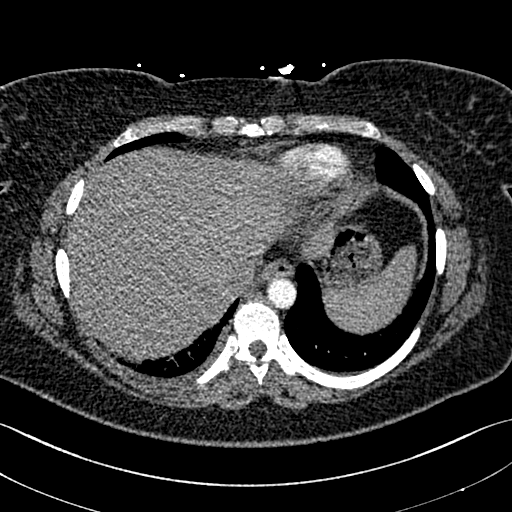
[im 90/243  lung]
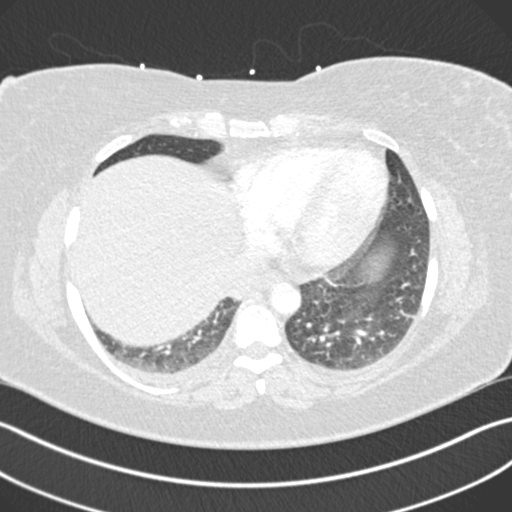
[im 102/243  mediastinal]
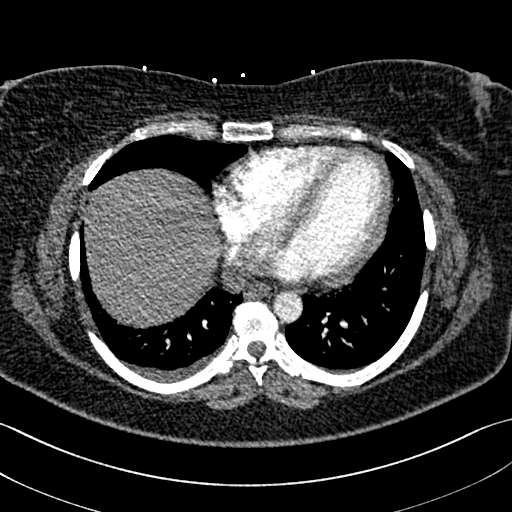
[im 128/243  lung]
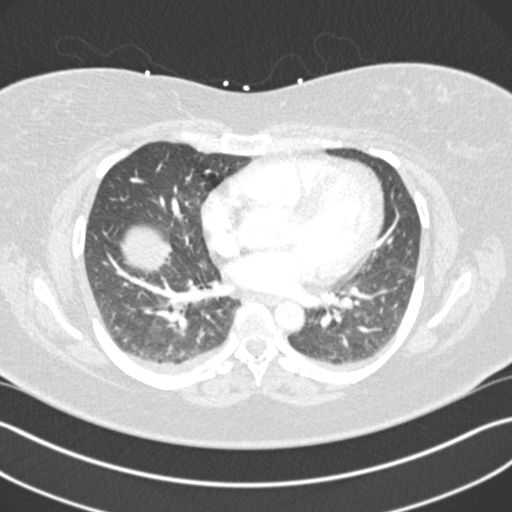
[im 141/243  mediastinal]
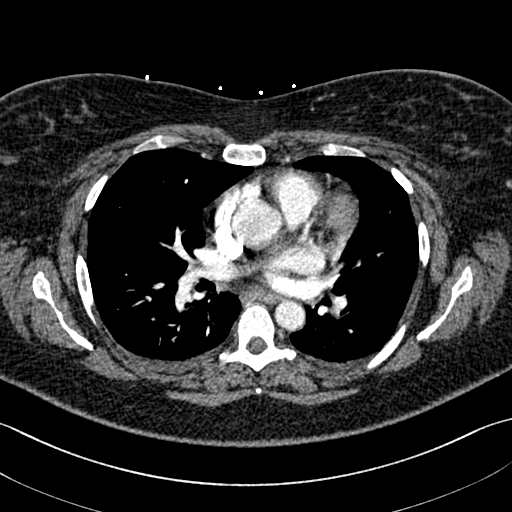
[im 153/243  lung]
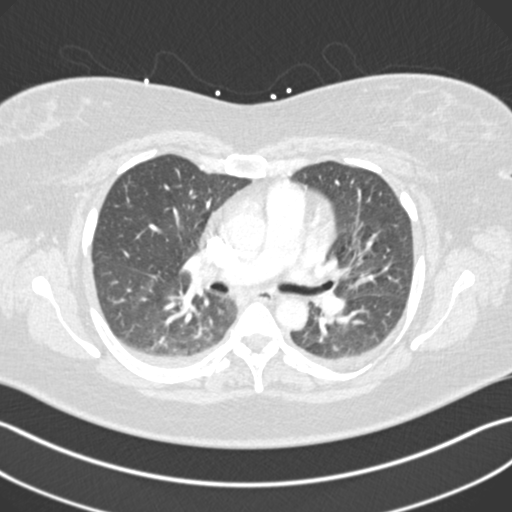
[im 166/243  mediastinal]
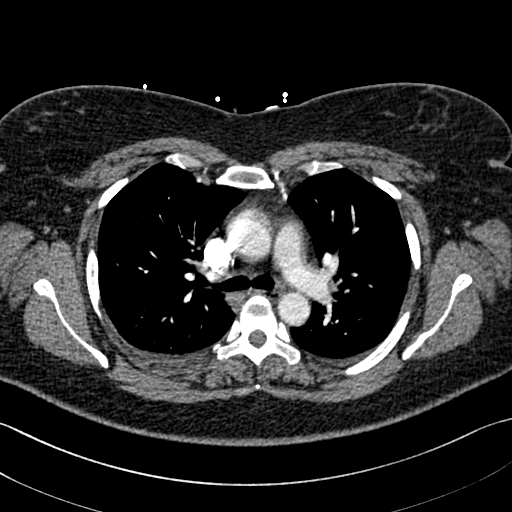
[im 179/243  lung]
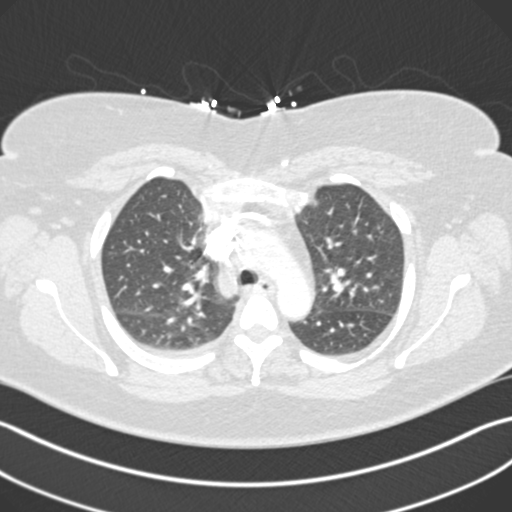
[im 192/243  mediastinal]
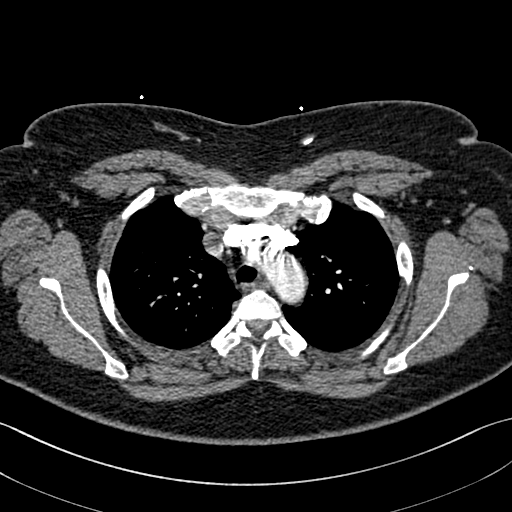
[im 204/243  lung]
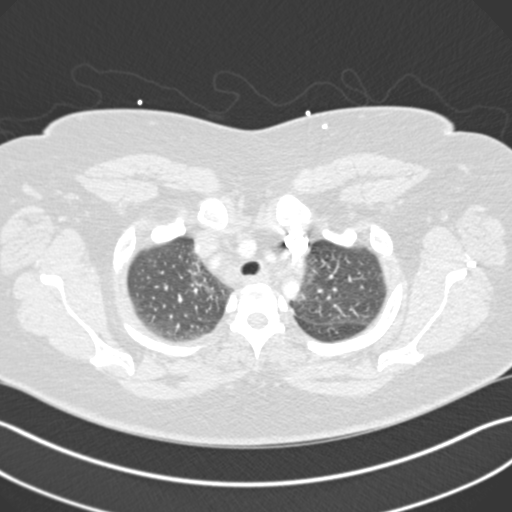
[im 217/243  mediastinal]
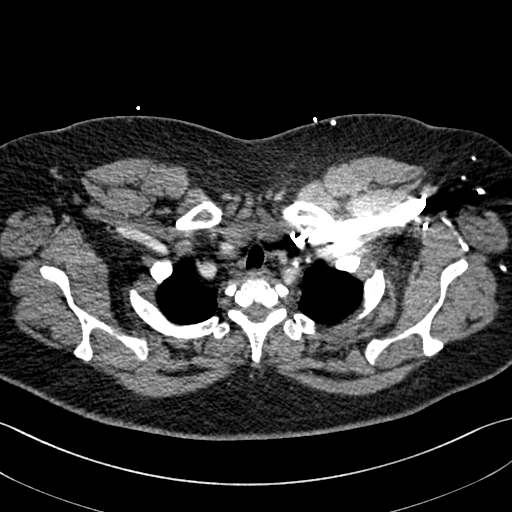
[im 230/243  lung]
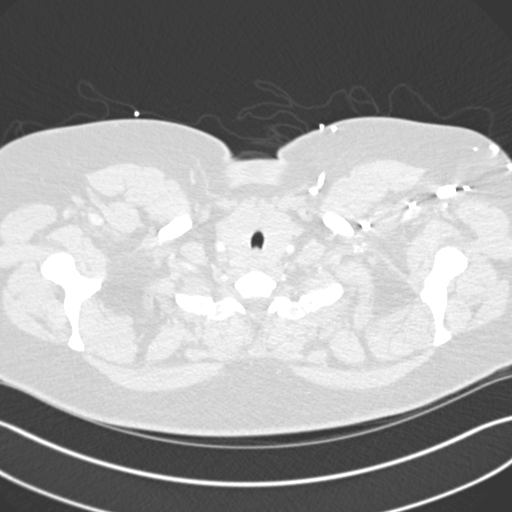

[Series 8: pe coronal mpr · coronal · 0.52mm/px · 1 of 124 slices shown]
[im 62/124  mediastinal]
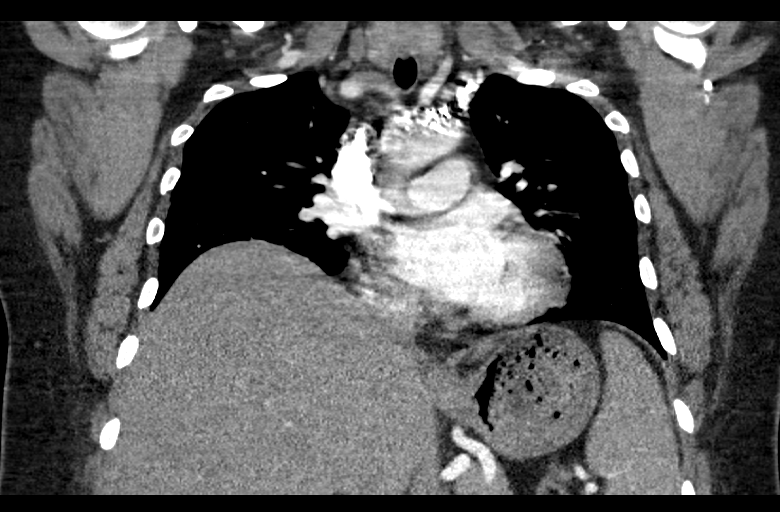

[18 of 36 positions shown; findings below may reference images not displayed]

FINDINGS: Cardiovascular: There is no evidence of thoracic aortic dissection
or aneurysm. There is no definite evidence of large central
pulmonary embolus. However, due to soft tissue attenuation secondary
to body habitus as well as some degree of respiratory motion
artifact, smaller peripheral pulmonary emboli cannot be ruled out on
the lower lobe branches of the pulmonary arteries. No pericardial
effusion is noted.

Mediastinum/Nodes: No enlarged mediastinal, hilar, or axillary lymph
nodes. Thyroid gland, trachea, and esophagus demonstrate no
significant findings.

Lungs/Pleura: No pneumothorax is noted. Minimal bilateral pleural
effusions are noted. No consolidative process is noted.

Upper Abdomen: No acute abnormality.

Musculoskeletal: No chest wall abnormality. No acute or significant
osseous findings.

Review of the MIP images confirms the above findings.
IMPRESSION: No definite evidence of large central pulmonary embolus. However,
due to soft tissue attenuation secondary to body habitus as well
some degree of respiratory motion artifact, smaller peripheral
pulmonary emboli cannot be excluded in the lower lobe branches of
the pulmonary arteries on the basis of this study.

Minimal bilateral pleural effusions are noted. No other abnormality
seen in the chest.

## 2017-10-28 DIAGNOSIS — Z130 Encounter for screening for diseases of the blood and blood-forming organs and certain disorders involving the immune mechanism: Secondary | ICD-10-CM | POA: Diagnosis not present

## 2017-10-28 DIAGNOSIS — E039 Hypothyroidism, unspecified: Secondary | ICD-10-CM | POA: Diagnosis not present

## 2017-10-28 DIAGNOSIS — M549 Dorsalgia, unspecified: Secondary | ICD-10-CM | POA: Diagnosis not present

## 2017-10-28 DIAGNOSIS — Z13220 Encounter for screening for lipoid disorders: Secondary | ICD-10-CM | POA: Diagnosis not present

## 2017-12-29 ENCOUNTER — Emergency Department (HOSPITAL_BASED_OUTPATIENT_CLINIC_OR_DEPARTMENT_OTHER)
Admission: EM | Admit: 2017-12-29 | Discharge: 2017-12-29 | Disposition: A | Discharge status: Home / Self Care | Payer: BLUE CROSS/BLUE SHIELD | Attending: Emergency Medicine | Admitting: Emergency Medicine

## 2017-12-29 ENCOUNTER — Other Ambulatory Visit: Payer: Self-pay

## 2017-12-29 ENCOUNTER — Encounter (HOSPITAL_BASED_OUTPATIENT_CLINIC_OR_DEPARTMENT_OTHER): Payer: Self-pay | Admitting: *Deleted

## 2017-12-29 ENCOUNTER — Emergency Department (HOSPITAL_BASED_OUTPATIENT_CLINIC_OR_DEPARTMENT_OTHER): Payer: BLUE CROSS/BLUE SHIELD

## 2017-12-29 DIAGNOSIS — R1032 Left lower quadrant pain: Secondary | ICD-10-CM | POA: Insufficient documentation

## 2017-12-29 DIAGNOSIS — R109 Unspecified abdominal pain: Secondary | ICD-10-CM

## 2017-12-29 DIAGNOSIS — Z79899 Other long term (current) drug therapy: Secondary | ICD-10-CM | POA: Insufficient documentation

## 2017-12-29 DIAGNOSIS — R110 Nausea: Secondary | ICD-10-CM

## 2017-12-29 DIAGNOSIS — R197 Diarrhea, unspecified: Secondary | ICD-10-CM | POA: Diagnosis not present

## 2017-12-29 LAB — COMPREHENSIVE METABOLIC PANEL
ALT: 14 U/L (ref 14–54)
AST: 15 U/L (ref 15–41)
Albumin: 3.9 g/dL (ref 3.5–5.0)
Alkaline Phosphatase: 83 U/L (ref 38–126)
Anion gap: 11 (ref 5–15)
BUN: 7 mg/dL (ref 6–20)
CO2: 28 mmol/L (ref 22–32)
Calcium: 9.1 mg/dL (ref 8.9–10.3)
Chloride: 100 mmol/L — ABNORMAL LOW (ref 101–111)
Creatinine, Ser: 0.81 mg/dL (ref 0.44–1.00)
GFR calc Af Amer: >60 mL/min (ref >60)
GFR calc non Af Amer: >60 mL/min (ref >60)
Glucose, Bld: 99 mg/dL (ref 65–99)
Potassium: 3.8 mmol/L (ref 3.5–5.1)
Sodium: 139 mmol/L (ref 135–145)
Total Bilirubin: 0.5 mg/dL (ref 0.3–1.2)
Total Protein: 8.1 g/dL (ref 6.5–8.1)

## 2017-12-29 LAB — URINALYSIS, ROUTINE W REFLEX MICROSCOPIC
Bilirubin Urine: NEGATIVE
Glucose, UA: NEGATIVE mg/dL
Hgb urine dipstick: NEGATIVE
Ketones, ur: NEGATIVE mg/dL
Leukocytes, UA: NEGATIVE
Nitrite: NEGATIVE
Protein, ur: NEGATIVE mg/dL
Specific Gravity, Urine: 1.01 (ref 1.005–1.030)
pH: 8 (ref 5.0–8.0)

## 2017-12-29 LAB — CBC WITH DIFFERENTIAL/PLATELET
Basophils Absolute: 0 K/uL (ref 0.0–0.1)
Basophils Relative: 0 %
Eosinophils Absolute: 0.1 K/uL (ref 0.0–0.7)
Eosinophils Relative: 1 %
HCT: 36 % (ref 36.0–46.0)
Hemoglobin: 11.8 g/dL — ABNORMAL LOW (ref 12.0–15.0)
Lymphocytes Relative: 19 %
Lymphs Abs: 1.5 K/uL (ref 0.7–4.0)
MCH: 24.7 pg — ABNORMAL LOW (ref 26.0–34.0)
MCHC: 32.8 g/dL (ref 30.0–36.0)
MCV: 75.5 fL — ABNORMAL LOW (ref 78.0–100.0)
Monocytes Absolute: 0.4 K/uL (ref 0.1–1.0)
Monocytes Relative: 5 %
Neutro Abs: 6.1 K/uL (ref 1.7–7.7)
Neutrophils Relative %: 76 %
Platelets: 333 K/uL (ref 150–400)
RBC: 4.77 MIL/uL (ref 3.87–5.11)
RDW: 14.7 % (ref 11.5–15.5)
WBC: 8.1 K/uL (ref 4.0–10.5)

## 2017-12-29 LAB — LIPASE, BLOOD: Lipase: 28 U/L (ref 11–51)

## 2017-12-29 LAB — PREGNANCY, URINE: Preg Test, Ur: NEGATIVE

## 2017-12-29 MED ORDER — LACTATED RINGERS IV SOLN
INTRAVENOUS | Status: DC
  Administered 2017-12-29: 14:00:00 via INTRAVENOUS

## 2017-12-29 MED ORDER — IOPAMIDOL (ISOVUE-300) INJECTION 61%
100.0000 mL | Freq: Once | INTRAVENOUS | Status: AC | PRN
  Administered 2017-12-29: 100 mL via INTRAVENOUS

## 2017-12-29 MED ORDER — LOPERAMIDE HCL 2 MG PO CAPS: 2.0000 mg | ORAL_CAPSULE | Freq: Four times a day (QID) | ORAL | 0 refills | Status: DC | PRN

## 2017-12-29 MED ORDER — PROMETHAZINE HCL 25 MG PO TABS: 25.0000 mg | ORAL_TABLET | Freq: Four times a day (QID) | ORAL | 0 refills | Status: DC | PRN

## 2017-12-29 MED ORDER — PROMETHAZINE HCL 25 MG/ML IJ SOLN
12.5000 mg | Freq: Once | INTRAMUSCULAR | Status: AC
  Administered 2017-12-29: 12.5 mg via INTRAVENOUS
  Filled 2017-12-29: qty 1

## 2017-12-29 MED ORDER — DICYCLOMINE HCL 20 MG PO TABS: 20.0000 mg | ORAL_TABLET | Freq: Three times a day (TID) | ORAL | 0 refills | Status: DC | PRN

## 2017-12-29 MED ORDER — IOPAMIDOL (ISOVUE-300) INJECTION 61%
30.0000 mL | Freq: Once | INTRAVENOUS | Status: AC | PRN
  Administered 2017-12-29: 30 mL via ORAL

## 2017-12-29 MED FILL — DICYCLOMINE 20 MG TABLET: 20 | 6 days supply | Qty: 20 | Fill #0

## 2017-12-29 MED FILL — PROMETHAZINE 25 MG TABLET: 25 | 5 days supply | Qty: 20 | Fill #0

## 2017-12-29 NOTE — ED Notes (Signed)
Patient transported to CT 

## 2017-12-29 NOTE — ED Provider Notes (Signed)
[facility] EMERGENCY DEPARTMENT Provider Note   CSN: [id number] Arrival date & time: 12/29/17  1307     History   Chief Complaint Chief Complaint  Patient presents with  . Abdominal Pain    HPI [name] is a 47 y.o. female.  HPI   47 year old female with abdominal pain.  Onset Saturday.  Persistent since then.  Pain is diffuse, but focally worse in the left lower quadrant.  Initially intermittent but is now fairly constant.  Associated nausea.  No vomiting.  No urinary complaints. No vaginal bleeding or discharge.  Objective fever.  She has a past history of diverticulitis but states that she remembers those symptoms being a little different than her current.  Surgical history is significant for a cholecystectomy.  She is seen at her PCPs office prior to arrival and referred to the emergency room for further evaluation.  Past Medical History:  Diagnosis Date  . Anemia   . Anxiety   . Chronic back pain   . GERD (gastroesophageal reflux disease)   . Headache    migraines  . PONV (postoperative nausea and vomiting)   . Thyroid disease    elevated thyroid, no meds currently as of 10/11/15  . Wears glasses     There are no active problems to display for this patient.   Past Surgical History:  Procedure Laterality Date  . CHOLECYSTECTOMY    . DILATION AND CURETTAGE OF UTERUS    . ECTOPIC PREGNANCY SURGERY    . SHOULDER ARTHROSCOPY WITH SUBACROMIAL DECOMPRESSION Left 11/08/2014   Procedure: LEFT SHOULDER ARTHROSCOPY SUBACROMIAL DECOMPRESSION AND DISTAL CLAVICAL RESECTION;  Surgeon: Francena Hanly, MD;  Location: [facility] OR;  Service: Orthopedics;  Laterality: Left;     OB History    Gravida  0   Para  0   Term  0   Preterm  0   AB  0   Living        SAB  0   TAB  0   Ectopic  0   Multiple      Live Births               Home Medications    Prior to Admission medications   Medication Sig Start Date End Date Taking? Authorizing  Provider  DULoxetine (CYMBALTA) 60 MG capsule Take 60 mg by mouth daily.   Yes [provider]  rOPINIRole (REQUIP) 0.25 MG tablet Take 0.25 mg by mouth 3 (three) times daily.   Yes [provider]  Acetaminophen-Codeine (TYLENOL WITH CODEINE #3 PO) Take by mouth.    [provider]  docusate sodium (COLACE) 100 MG capsule Take 100 mg by mouth 2 (two) times daily as needed for mild constipation.    [provider]  famotidine (PEPCID) 20 MG tablet Take 20 mg by mouth 2 (two) times daily as needed for heartburn or indigestion.    [provider]  levothyroxine (SYNTHROID, LEVOTHROID) 75 MCG tablet Take 75 mcg by mouth daily before breakfast.    [provider]  loratadine (CLARITIN) 10 MG tablet Take 10 mg by mouth daily.    [provider]  naproxen (NAPROSYN) 500 MG tablet Take 500 mg by mouth 2 (two) times daily as needed for mild pain.    [provider]  naproxen (NAPROSYN) 500 MG tablet Take 1 tablet (500 mg total) by mouth 2 (two) times daily with a meal. 11/08/14   Shuford, French Ana, PA-C  Norethin-Eth Estrad-Fe Biphas (LO  LOESTRIN FE PO) Take by mouth.    [provider]  ondansetron (ZOFRAN) 4 MG tablet Take 1 tablet (4 mg total) by mouth every 8 (eight) hours as needed for nausea or vomiting. 11/08/14   Shuford, French Anaracy, PA-C  oxyCODONE-acetaminophen (PERCOCET) 5-325 MG per tablet Take 1-2 tablets by mouth every 4 (four) hours as needed. 11/08/14   Shuford, French Anaracy, PA-C  tamsulosin (FLOMAX) 0.4 MG CAPS capsule Take 0.4 mg by mouth.    [provider]  TiZANidine HCl (ZANAFLEX PO) Take by mouth.    [provider]    Family History Family History  Problem Relation Age of Onset  . Hypercholesterolemia Mother   . Diabetes Father   . Hypertension Father     Social History Social History   Tobacco Use  . Smoking status: Never Smoker  . Smokeless tobacco: Never Used  Substance Use Topics  .  Alcohol use: Yes    Comment: rare  . Drug use: No     Allergies   Gabapentin   Review of Systems Review of Systems  All systems reviewed and negative, other than as noted in HPI.  Physical Exam Updated Vital Signs BP 129/88   Pulse 74   Temp 98.5 F (36.9 C)   Resp 18   Ht 5\' 2"  (1.575 m)   Wt 104.3 kg (230 lb)   LMP 12/21/2017   SpO2 100%   BMI 42.07 kg/m   Physical Exam  Constitutional: She appears well-developed and well-nourished. No distress.  HENT:  Head: Normocephalic and atraumatic.  Eyes: Conjunctivae are normal. Right eye exhibits no discharge. Left eye exhibits no discharge.  Neck: Neck supple.  Cardiovascular: Normal rate, regular rhythm and normal heart sounds. Exam reveals no gallop and no friction rub.  No murmur heard. Pulmonary/Chest: Effort normal and breath sounds normal. No respiratory distress.  Abdominal: Soft. She exhibits no distension. There is tenderness.  Tenderness soft.  Nondistended.  Suprapubic and left lower quadrant tenderness without rebound or guarding.  Musculoskeletal: She exhibits no edema or tenderness.  Neurological: She is alert.  Skin: Skin is warm and dry.  Psychiatric: She has a normal mood and affect. Her behavior is normal. Thought content normal.  Nursing note and vitals reviewed.    ED Treatments / Results  Labs (all labs ordered are listed, but only abnormal results are displayed) Labs Reviewed  COMPREHENSIVE METABOLIC PANEL - Abnormal; Notable for the following components:      Result Value   Chloride 100 (*)    All other components within normal limits  CBC WITH DIFFERENTIAL/PLATELET - Abnormal; Notable for the following components:   Hemoglobin 11.8 (*)    MCV 75.5 (*)    MCH 24.7 (*)    All other components within normal limits  URINALYSIS, ROUTINE W REFLEX MICROSCOPIC  PREGNANCY, URINE  LIPASE, BLOOD    EKG None  Radiology No results found.  Procedures Procedures (including critical care  time)  Medications Ordered in ED Medications  lactated ringers infusion ( Intravenous New Bag/Given 12/29/17 1426)  iopamidol (ISOVUE-300) 61 % injection 30 mL (has no administration in time range)  iopamidol (ISOVUE-300) 61 % injection 100 mL (has no administration in time range)  promethazine (PHENERGAN) injection 12.5 mg (12.5 mg Intravenous Given 12/29/17 1430)     Initial Impression / Assessment and Plan / ED Course  I have reviewed the triage vital signs and the nursing notes.  Pertinent labs & imaging results that were available during my care  of the patient were reviewed by me and considered in my medical decision making (see chart for details).    47 year old female with a several day history of nausea and left lower quadrant pain.  I suspect she may have diverticulitis.  Labs thus far fairly unremarkable.  She is given some fluids and nausea medication.  She declined pain medicine.  Disposition pending CT results.  Final Clinical Impressions(s) / ED Diagnoses   Final diagnoses:  Abdominal pain, unspecified abdominal location    ED Discharge Orders    None       Raeford Razor, MD 12/29/17 1511

## 2017-12-29 NOTE — ED Triage Notes (Signed)
Pt c/o lowe left abd pain, nausea only x 4 days , sent here from PMD office

## 2017-12-29 NOTE — ED Provider Notes (Signed)
Blood pressure 133/79, pulse 63, temperature 98.5 F (36.9 C), resp. rate 16, height 5\' 2"  (1.575 m), weight 104.3 kg (230 lb), last menstrual period 12/21/2017, SpO2 100 %.  Assuming care from Dr. Juleen ChinaKohut.  In short, [name] is a 47 y.o. female with a chief complaint of Abdominal Pain .  Refer to the original H&P for additional details.  The current plan of care is to f/u CT abdomen/pelvis.  05:02 PM Patient CT reviewed with no acute findings. No pain over umbilical hernia. No vaginal or other GU symptoms. Extremely low suspicion for ovarian torsion. Plan for Phenergan, Bentyl, and Imodium. Suspect viral process but discussed strict ED return precautions if symptoms worsen suddenly.   At this time, I do not feel there is any life-threatening condition present. I have reviewed and discussed all results (EKG, imaging, lab, urine as appropriate), exam findings with patient. I have reviewed nursing notes and appropriate previous records.  I feel the patient is safe to be discharged home without further emergent workup. Discussed usual and customary return precautions. Patient and family (if present) verbalize understanding and are comfortable with this plan.  Patient will follow-up with their primary care provider. If they do not have a primary care provider, information for follow-up has been provided to them. All questions have been answered.  Alona BeneJoshua Minnie Shi, MD Emergency Medicine     Akshay Spang, Arlyss RepressJoshua G, MD 12/29/17 [phone]

## 2017-12-29 NOTE — Discharge Instructions (Signed)

## 2018-05-10 DIAGNOSIS — N3289 Other specified disorders of bladder: Secondary | ICD-10-CM | POA: Diagnosis not present

## 2018-05-10 DIAGNOSIS — Z23 Encounter for immunization: Secondary | ICD-10-CM | POA: Diagnosis not present

## 2018-05-10 DIAGNOSIS — M5440 Lumbago with sciatica, unspecified side: Secondary | ICD-10-CM | POA: Diagnosis not present

## 2018-05-10 DIAGNOSIS — G479 Sleep disorder, unspecified: Secondary | ICD-10-CM | POA: Diagnosis not present

## 2018-05-10 DIAGNOSIS — F419 Anxiety disorder, unspecified: Secondary | ICD-10-CM | POA: Diagnosis not present

## 2018-06-30 DIAGNOSIS — G894 Chronic pain syndrome: Secondary | ICD-10-CM | POA: Diagnosis not present

## 2018-06-30 DIAGNOSIS — M5116 Intervertebral disc disorders with radiculopathy, lumbar region: Secondary | ICD-10-CM | POA: Diagnosis not present

## 2018-06-30 DIAGNOSIS — F430 Acute stress reaction: Secondary | ICD-10-CM | POA: Diagnosis not present

## 2018-07-06 DIAGNOSIS — M5440 Lumbago with sciatica, unspecified side: Secondary | ICD-10-CM | POA: Diagnosis not present

## 2018-09-18 IMAGING — US US EXTREM LOW VENOUS BILAT
1 series · 13 of 24 positions shown · non-contrast
Comparison: None.

CLINICAL DATA: Chest pain.



[Series 1: us extrem low venous bilat · 0.08mm/px · 13 of 47 slices shown]
[im 1/47]
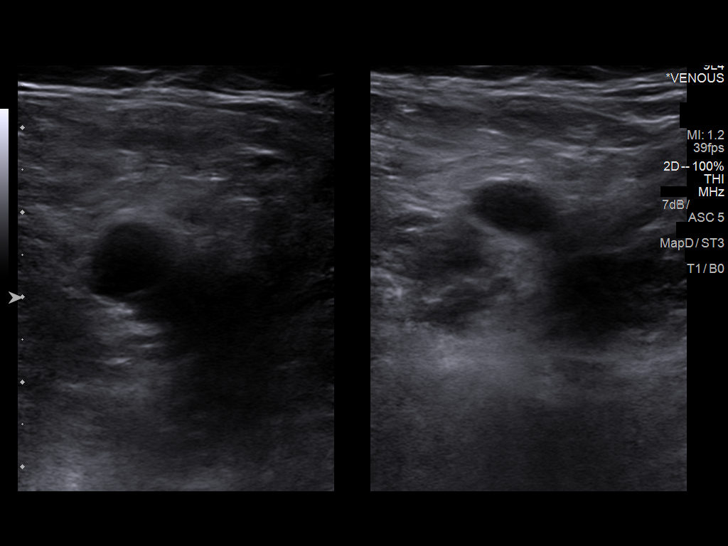
[im 5/47]
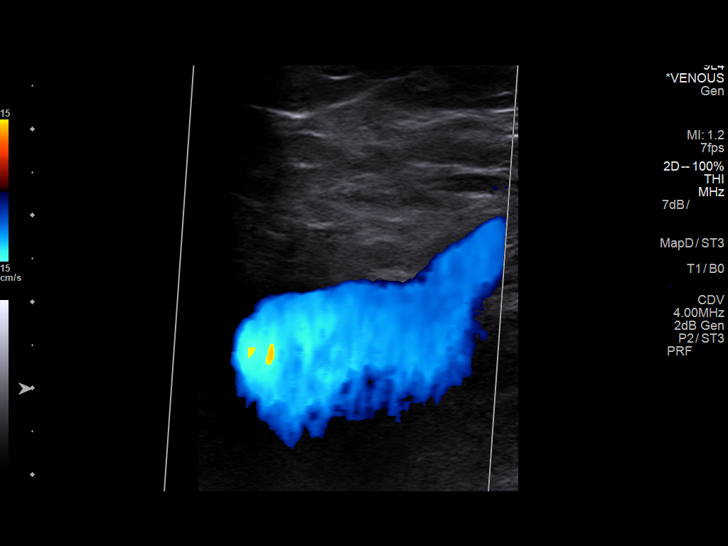
[im 9/47]
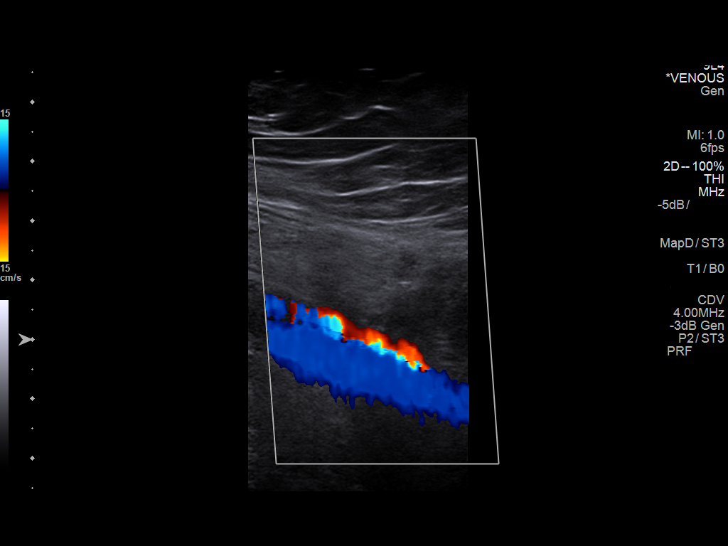
[im 13/47]
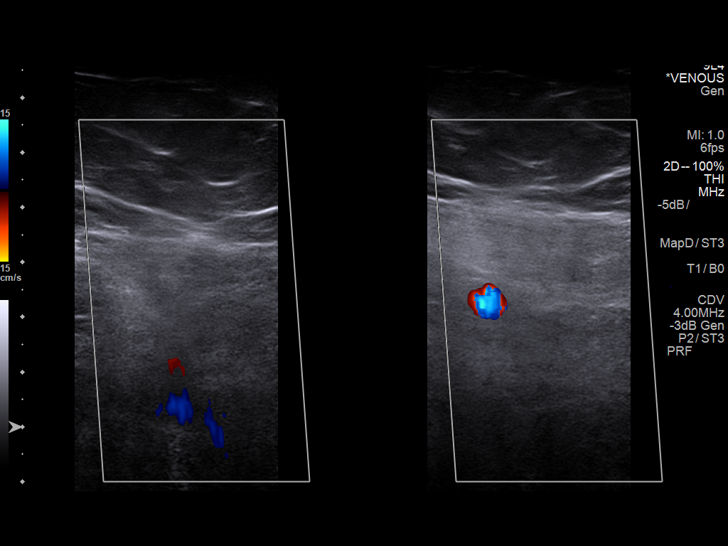
[im 17/47]
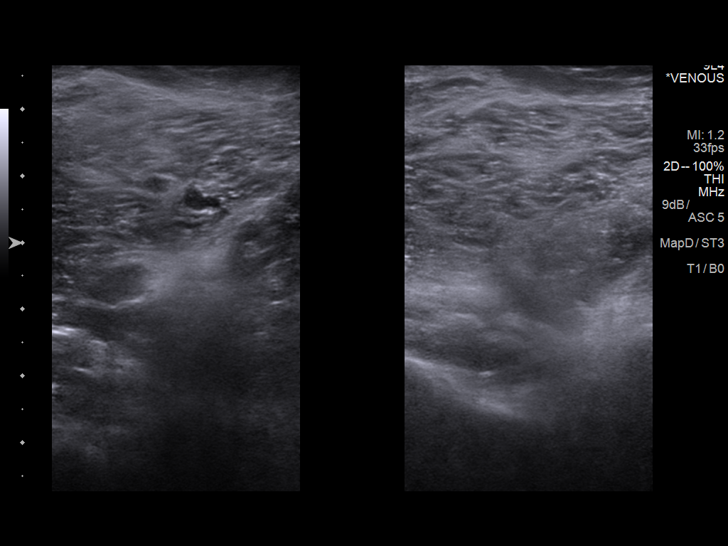
[im 21/47]
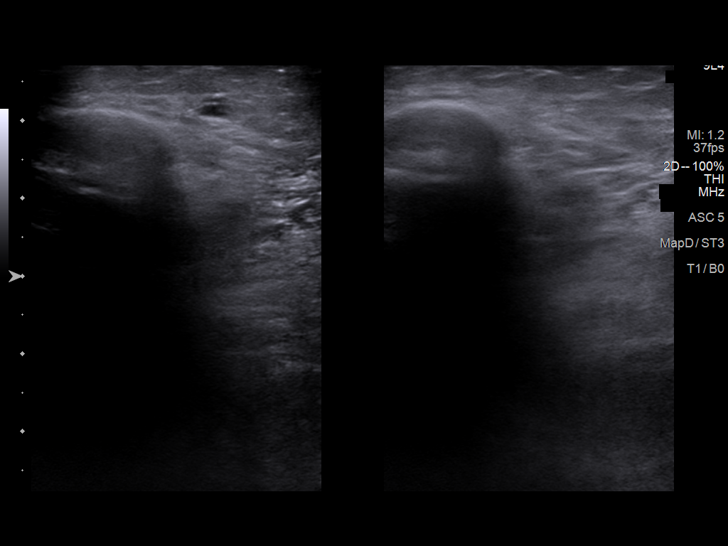
[im 25/47]
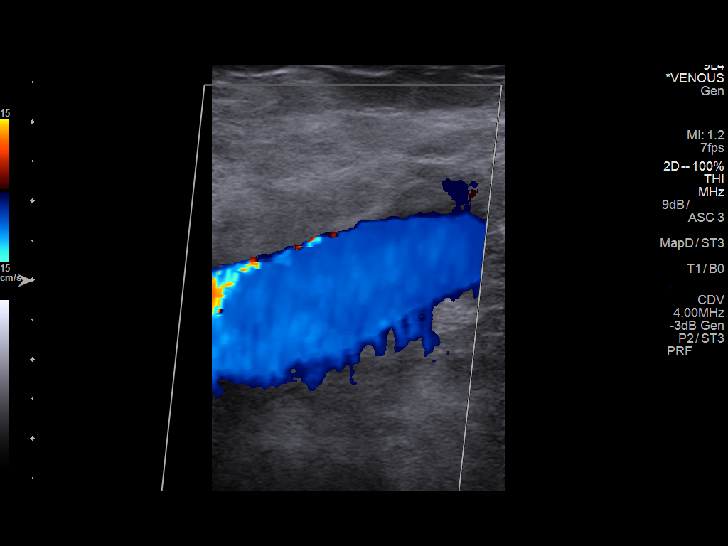
[im 27/47]
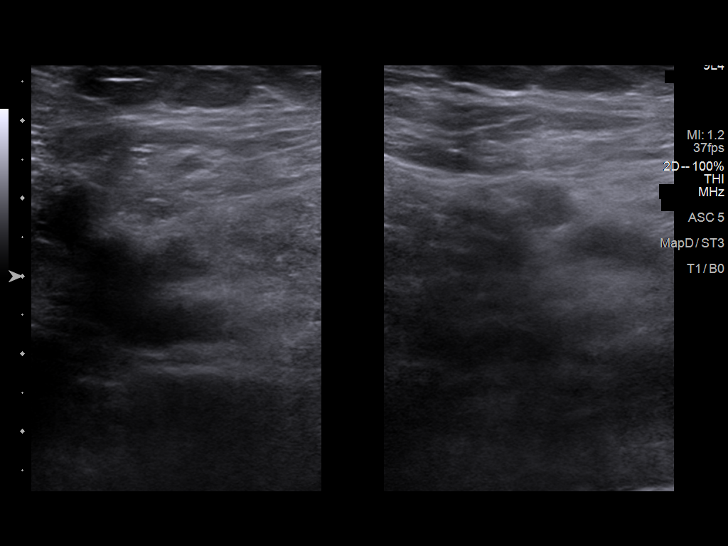
[im 31/47]
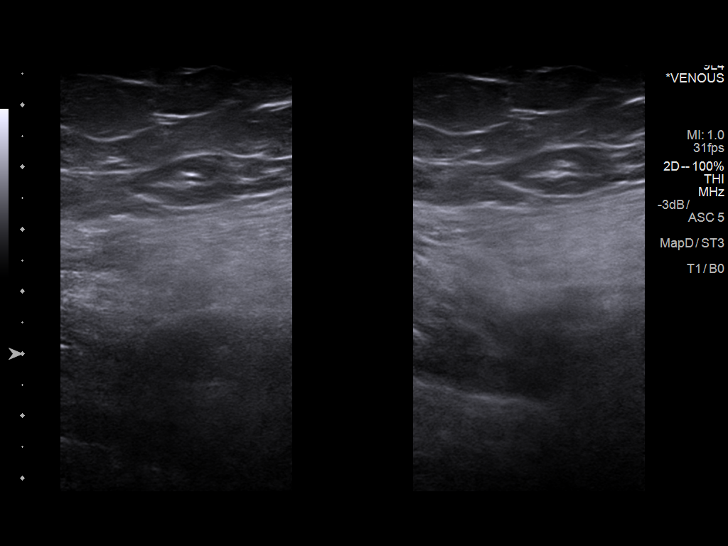
[im 35/47]
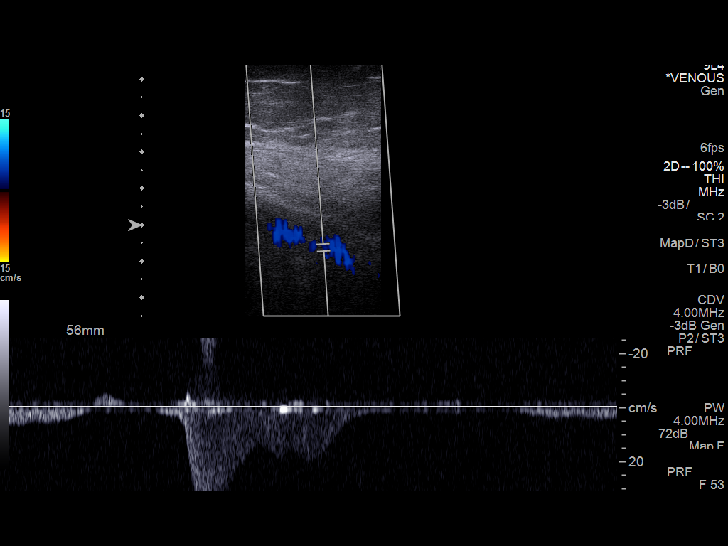
[im 39/47]
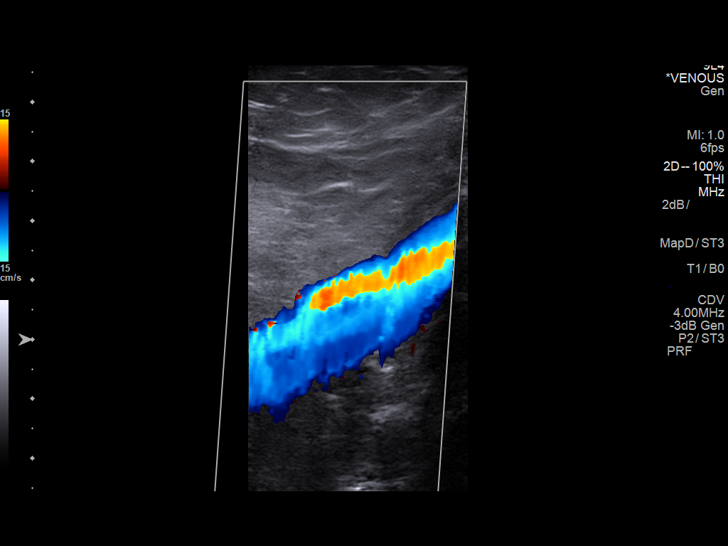
[im 43/47]
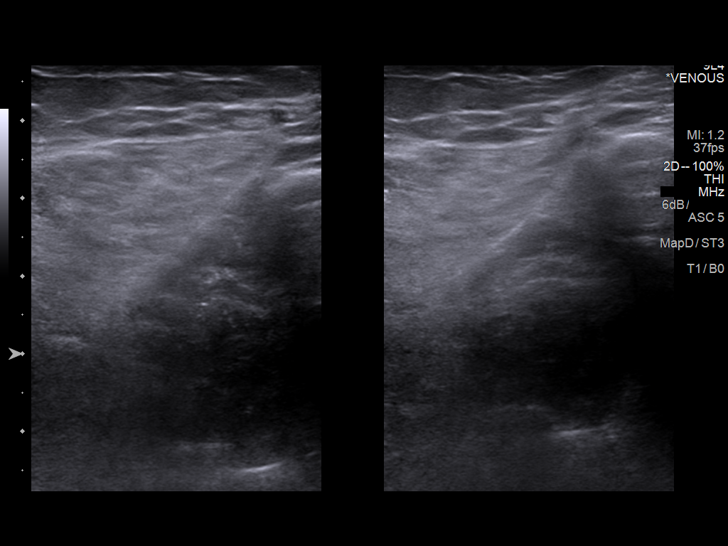
[im 47/47]
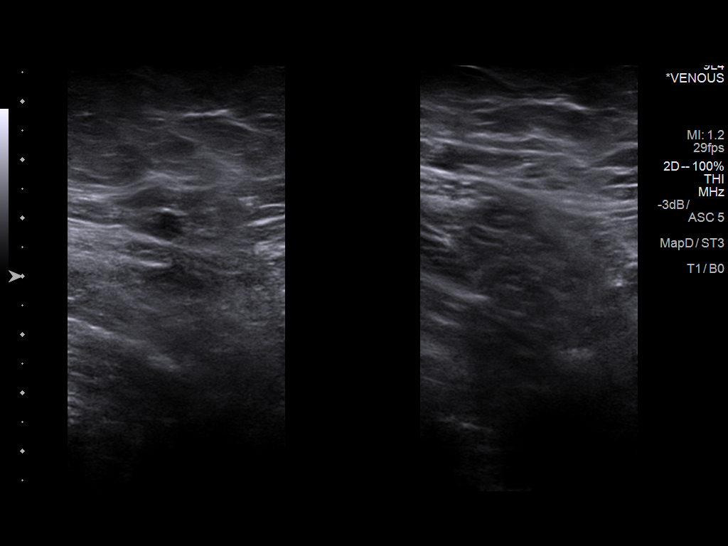

[13 of 24 positions shown; findings below may reference images not displayed]

FINDINGS: RIGHT LOWER EXTREMITY

Common Femoral Vein: No evidence of thrombus. Normal
compressibility, respiratory phasicity and response to augmentation.

Saphenofemoral Junction: No evidence of thrombus. Normal
compressibility and flow on color Doppler imaging.

Profunda Femoral Vein: No evidence of thrombus. Normal
compressibility and flow on color Doppler imaging.

Femoral Vein: No evidence of thrombus. Normal compressibility,
respiratory phasicity and response to augmentation.

Popliteal Vein: No evidence of thrombus. Normal compressibility,
respiratory phasicity and response to augmentation.

Calf Veins: No evidence of thrombus. Normal compressibility and flow
on color Doppler imaging. Peroneal veins not well visualized.

Superficial Great Saphenous Vein: No evidence of thrombus. Normal
compressibility and flow on color Doppler imaging.

Venous Reflux:  None.

Other Findings:  None.

LEFT LOWER EXTREMITY

Common Femoral Vein: No evidence of thrombus. Normal
compressibility, respiratory phasicity and response to augmentation.

Saphenofemoral Junction: No evidence of thrombus. Normal
compressibility and flow on color Doppler imaging.

Profunda Femoral Vein: No evidence of thrombus. Normal
compressibility and flow on color Doppler imaging.

Femoral Vein: No evidence of thrombus. Normal compressibility,
respiratory phasicity and response to augmentation.

Popliteal Vein: No evidence of thrombus. Normal compressibility,
respiratory phasicity and response to augmentation.

Calf Veins: No evidence of thrombus. Normal compressibility and flow
on color Doppler imaging. Peroneal veins not well visualized.

Superficial Great Saphenous Vein: No evidence of thrombus. Normal
compressibility and flow on color Doppler imaging.

Venous Reflux:  None.

Other Findings:  None.
IMPRESSION: No evidence of DVT within either lower extremity.

## 2018-10-08 DIAGNOSIS — H9201 Otalgia, right ear: Secondary | ICD-10-CM | POA: Diagnosis not present

## 2018-10-27 DIAGNOSIS — M5116 Intervertebral disc disorders with radiculopathy, lumbar region: Secondary | ICD-10-CM | POA: Diagnosis not present

## 2018-10-27 DIAGNOSIS — G894 Chronic pain syndrome: Secondary | ICD-10-CM | POA: Diagnosis not present

## 2018-12-19 DIAGNOSIS — Z20828 Contact with and (suspected) exposure to other viral communicable diseases: Secondary | ICD-10-CM | POA: Diagnosis not present

## 2019-01-18 DIAGNOSIS — G894 Chronic pain syndrome: Secondary | ICD-10-CM | POA: Diagnosis not present

## 2019-01-18 DIAGNOSIS — M5116 Intervertebral disc disorders with radiculopathy, lumbar region: Secondary | ICD-10-CM | POA: Diagnosis not present

## 2019-03-20 DIAGNOSIS — G4700 Insomnia, unspecified: Secondary | ICD-10-CM | POA: Diagnosis not present

## 2019-04-20 DIAGNOSIS — M5116 Intervertebral disc disorders with radiculopathy, lumbar region: Secondary | ICD-10-CM | POA: Diagnosis not present

## 2019-04-20 DIAGNOSIS — G894 Chronic pain syndrome: Secondary | ICD-10-CM | POA: Diagnosis not present

## 2019-05-25 ENCOUNTER — Other Ambulatory Visit: Payer: Self-pay | Admitting: Obstetrics & Gynecology

## 2019-05-25 DIAGNOSIS — Z01411 Encounter for gynecological examination (general) (routine) with abnormal findings: Secondary | ICD-10-CM | POA: Diagnosis not present

## 2019-05-25 DIAGNOSIS — N840 Polyp of corpus uteri: Secondary | ICD-10-CM | POA: Diagnosis not present

## 2019-05-25 DIAGNOSIS — Z3202 Encounter for pregnancy test, result negative: Secondary | ICD-10-CM | POA: Diagnosis not present

## 2019-05-25 DIAGNOSIS — N939 Abnormal uterine and vaginal bleeding, unspecified: Secondary | ICD-10-CM | POA: Diagnosis not present

## 2019-06-13 DIAGNOSIS — N840 Polyp of corpus uteri: Secondary | ICD-10-CM | POA: Diagnosis not present

## 2019-06-13 DIAGNOSIS — E039 Hypothyroidism, unspecified: Secondary | ICD-10-CM | POA: Diagnosis not present

## 2019-06-13 DIAGNOSIS — N939 Abnormal uterine and vaginal bleeding, unspecified: Secondary | ICD-10-CM | POA: Diagnosis not present

## 2019-06-28 ENCOUNTER — Other Ambulatory Visit: Payer: Self-pay

## 2019-06-28 ENCOUNTER — Encounter (HOSPITAL_BASED_OUTPATIENT_CLINIC_OR_DEPARTMENT_OTHER): Payer: Self-pay | Admitting: *Deleted

## 2019-06-30 ENCOUNTER — Other Ambulatory Visit (HOSPITAL_COMMUNITY)
Admission: RE | Admit: 2019-06-30 | Discharge: 2019-06-30 | Disposition: A | Discharge status: Home / Self Care | Payer: BC Managed Care – PPO | Source: Ambulatory Visit | Attending: Obstetrics & Gynecology | Admitting: Obstetrics & Gynecology

## 2019-06-30 DIAGNOSIS — Z01812 Encounter for preprocedural laboratory examination: Secondary | ICD-10-CM | POA: Diagnosis not present

## 2019-06-30 DIAGNOSIS — Z20828 Contact with and (suspected) exposure to other viral communicable diseases: Secondary | ICD-10-CM | POA: Insufficient documentation

## 2019-07-01 LAB — NOVEL CORONAVIRUS, NAA (HOSP ORDER, SEND-OUT TO REF LAB; TAT 18-24 HRS): SARS-CoV-2, NAA: NOT DETECTED

## 2019-07-03 ENCOUNTER — Other Ambulatory Visit: Payer: Self-pay

## 2019-07-03 ENCOUNTER — Encounter (HOSPITAL_BASED_OUTPATIENT_CLINIC_OR_DEPARTMENT_OTHER)
Admission: RE | Admit: 2019-07-03 | Discharge: 2019-07-03 | Disposition: A | Discharge status: Home / Self Care | Payer: BC Managed Care – PPO | Source: Ambulatory Visit | Attending: Obstetrics & Gynecology | Admitting: Obstetrics & Gynecology

## 2019-07-03 DIAGNOSIS — Z01812 Encounter for preprocedural laboratory examination: Secondary | ICD-10-CM | POA: Insufficient documentation

## 2019-07-03 DIAGNOSIS — F329 Major depressive disorder, single episode, unspecified: Secondary | ICD-10-CM | POA: Diagnosis not present

## 2019-07-03 DIAGNOSIS — Z79899 Other long term (current) drug therapy: Secondary | ICD-10-CM | POA: Diagnosis not present

## 2019-07-03 DIAGNOSIS — G2581 Restless legs syndrome: Secondary | ICD-10-CM | POA: Diagnosis not present

## 2019-07-03 DIAGNOSIS — Z87891 Personal history of nicotine dependence: Secondary | ICD-10-CM | POA: Diagnosis not present

## 2019-07-03 DIAGNOSIS — N939 Abnormal uterine and vaginal bleeding, unspecified: Secondary | ICD-10-CM | POA: Diagnosis not present

## 2019-07-03 DIAGNOSIS — F419 Anxiety disorder, unspecified: Secondary | ICD-10-CM | POA: Diagnosis not present

## 2019-07-03 DIAGNOSIS — Z79890 Hormone replacement therapy (postmenopausal): Secondary | ICD-10-CM | POA: Diagnosis not present

## 2019-07-03 DIAGNOSIS — N840 Polyp of corpus uteri: Secondary | ICD-10-CM | POA: Diagnosis not present

## 2019-07-03 DIAGNOSIS — Z888 Allergy status to other drugs, medicaments and biological substances status: Secondary | ICD-10-CM | POA: Diagnosis not present

## 2019-07-03 DIAGNOSIS — M5116 Intervertebral disc disorders with radiculopathy, lumbar region: Secondary | ICD-10-CM | POA: Diagnosis not present

## 2019-07-03 DIAGNOSIS — Z6841 Body Mass Index (BMI) 40.0 and over, adult: Secondary | ICD-10-CM | POA: Diagnosis not present

## 2019-07-03 DIAGNOSIS — E039 Hypothyroidism, unspecified: Secondary | ICD-10-CM | POA: Diagnosis not present

## 2019-07-03 LAB — CBC
HCT: 37.7 % (ref 36.0–46.0)
Hemoglobin: 11.8 g/dL — ABNORMAL LOW (ref 12.0–15.0)
MCH: 25.2 pg — ABNORMAL LOW (ref 26.0–34.0)
MCHC: 31.3 g/dL (ref 30.0–36.0)
MCV: 80.6 fL (ref 80.0–100.0)
Platelets: 285 10*3/uL (ref 150–400)
RBC: 4.68 MIL/uL (ref 3.87–5.11)
RDW: 14.4 % (ref 11.5–15.5)
WBC: 7 10*3/uL (ref 4.0–10.5)
nRBC: 0 % (ref 0.0–0.2)

## 2019-07-03 LAB — BASIC METABOLIC PANEL
Anion gap: 9 (ref 5–15)
BUN: 5 mg/dL — ABNORMAL LOW (ref 6–20)
CO2: 27 mmol/L (ref 22–32)
Calcium: 8.6 mg/dL — ABNORMAL LOW (ref 8.9–10.3)
Chloride: 103 mmol/L (ref 98–111)
Creatinine, Ser: 0.83 mg/dL (ref 0.44–1.00)
GFR calc Af Amer: >60 mL/min (ref >60)
GFR calc non Af Amer: >60 mL/min (ref >60)
Glucose, Bld: 95 mg/dL (ref 70–99)
Potassium: 3.7 mmol/L (ref 3.5–5.1)
Sodium: 139 mmol/L (ref 135–145)

## 2019-07-03 LAB — POCT PREGNANCY, URINE: Preg Test, Ur: NEGATIVE

## 2019-07-03 NOTE — H&P (Signed)
48yo [id number] who presents for hysteroscopy, D&C, possible myosure polypectomy and ablation to AUB   In review, she prevously reported that her periods continue to be heavy and in fact this past one was the worst- lasted for 8 days with 5 heavy days- soaking through a pad every 2 hours. . Additionally, she notes considerable pelvic pain, mostly on her left side- starts a few days before and will continue until after her period. Deneis intermenstrual or postcoital bleeding.  Denies abnormal discharge, itching or irritation. Denies sexual dysfunction. No other acute complaints  Contraception: condoms  EMB: 05/2019: benign secretory endometrium -Last [place] 12/04/16: 8.5cm uterus with normal shape and size. Thickened endometrium measuring 11cm with 2cm hyperechoic mass. Normal ovaries bilaterally.  Current Medications  Taking   Ropinirole HCl 0.25 MG Tablet 1 tablet 1 to 3 hours before bedtime Orally Once a day for restless leg   Levothyroxine Sodium 75 MCG Tablet 1 tablet on an empty stomach in the morning Orally Once a day, stop the 50 mcg dose   Ondansetron HCl 4 MG Tablet 1 tablet Orally every 8 hours as needed   Sumatriptan Succinate 25 MG Tablet 1 tablet Orally take at onset of headache, may repeat in 2 hrs if no improvement   Hydrocodone-Ibuprofen 7.5-200 MG Tablet 1 tablet as needed (may fill 06/22/2019) Twice daily as needed for severe pain   Acetaminophen-Codeine #3 300-30 MG Tablet 1-2 tablets as needed (may fill 05/14/2019) every 8 hours as needed   Zoloft(Sertraline HCl) 100 MG Tablet 1 tablet Orally Once a day   Zanaflex(tiZANidine HCl) 4 MG Tablet 1 tablet as needed Orally three times a day as needed   Vitamin D-3 5000 UNIT Tablet 1 tablet Orally Once a day   Medication List reviewed and reconciled with the patient    Past Medical History  Obesity.   Insomnia.   Depression, major, in remission.   Anxiety disorder, unspecified.   Hypothyroidism, unspecified type.   Lumbar disc  herniation with radiculopathy.    Surgical History  lap cholecytstectomy 2004  D&C x2 2003  Ectopic pregnancy 2009  Rotator Cuff Repair 2015   Family History  Father: alive, HPTN, diagnosed with Hypertension  Mother: alive, High cholesterol  Paternal Grand Father: deceased, died in an accident  Paternal [place] Mother: deceased, seizuzres  Maternal [place] Father: deceased, CAD  Maternal Grand Mother: deceased, polio as a child  Sister 1: alive, obesity, Stage IV non-Hodgkins lymphoma  1 sister(s) .    Social History  General:  no Firearms at home.  no EXPOSURE TO PASSIVE SMOKE.  Alcohol: yes, 1-2 per week.  EDUCATION: College, Associated Degree,Nursing.  Seat belt use: yes.  Home smoke detector use: yes.  no Recreational drug use.  Exercise: yes, treadmill for , 30 min , 3 times weekly.  Caffeine: 2+ servings daily of diet soda.  Tobacco use  cigarettes: Former smoker Quit in year 1993 Tobacco history last updated 06/13/2019 Vaping No Religion: none.  Marital Status: married.  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    Gyn History  Sexual activity currently sexually active.  Periods : irregular, heavy.  LMP 05/27/2019.  Birth control condoms.  Last pap smear date 11-2016 .  Last mammogram date 04-2014 .    OB History  Pregnancy # 1 non viable.  Pregnancy # 2 full term vaginal [place] .  Pregnancy # 3 miscarriage.  Pregnancy # 4: ectopic.    Allergies  Gabapentin   Hospitalization/Major Diagnostic Procedure  No Hospitalization  History.   Review of Systems  CONSTITUTIONAL:  no Appetite changes. no Chills. no Fatigue. no Fever. Night sweats yes.  CARDIOLOGY:  no Chest pain.  RESPIRATORY:  no Shortness of breath. no Cough.  UROLOGY:  no Dysuria. no Urinary frequency. no Urinary incontinence. no Urinary urgency.  GASTROENTEROLOGY:  Abdominal pain yes. Bloating/belching yes. no Change in bowel habits. no Change in bowel movements. Nausea yes.  FEMALE  REPRODUCTIVE:  See HPI for details. no Breast lumps or discharge. no Breast pain.  NEUROLOGY:  no Dizziness. Headache yes.  PSYCHOLOGY:  Anxiety yes. Depression yes.  SKIN:  no Rash. no Hives.  HEMATOLOGY/LYMPH:  no Anemia. Using Blood Thinners no.     O:  Vital Signs  Wt 225.3, Wt change -2.7 lb, Ht 62.5, BMI 40.55, Temp 97.1, Pulse sitting 65, BP sitting 128/88.   Examination  General Examination: CONSTITUTIONAL: well nourished, well developed.  SKIN: warm & dry, no rashes.  NECK: supple, normal appearance.  LUNGS: clear to auscultation bilaterally, no wheezes, rhonchi, rales.  HEART: no murmurs, regular rate and rhythm.  ABDOMEN: obese, no masses palpated,soft and not tender, no rebound, no guarding.  FEMALE GENITOURINARY: Normal urethra, No external lesions, Vagina - pink moist mucosa, no lesions or abnormal discharge,cervix - visualized, no discharge or lesions.  EXTREMITIES: no edema present, no calf tenderness bilaterally.  LYMPH NODES: no inguinal adenopathy, no axillary adenopathy.  PSYCH: oriented x 3, appropriate mood and affect.    A/P: 03KV [id number] who presents for hysteroscopy, D&C, possible myosure polypectomy and ablation to AUB  -NPO -LR @ 125cc/hr -SCDs to OR -Risk/benefit and alternatives reviewed with patient including risk of bleeding, infection and injury due to uterine perforation and or failure of ablation.  Questions and concerns were addressed and she desires to proceed  Myna Hidalgo, DO [phone] (cell) [phone] (office)

## 2019-07-03 NOTE — Progress Notes (Signed)

## 2019-07-04 ENCOUNTER — Encounter (HOSPITAL_BASED_OUTPATIENT_CLINIC_OR_DEPARTMENT_OTHER): Payer: Self-pay | Admitting: Obstetrics & Gynecology

## 2019-07-04 ENCOUNTER — Ambulatory Visit (HOSPITAL_BASED_OUTPATIENT_CLINIC_OR_DEPARTMENT_OTHER)
Admission: RE | Admit: 2019-07-04 | Discharge: 2019-07-04 | Disposition: A | Discharge status: Home / Self Care | Payer: BC Managed Care – PPO | Attending: Obstetrics & Gynecology | Admitting: Obstetrics & Gynecology

## 2019-07-04 ENCOUNTER — Other Ambulatory Visit: Payer: Self-pay

## 2019-07-04 ENCOUNTER — Encounter (HOSPITAL_BASED_OUTPATIENT_CLINIC_OR_DEPARTMENT_OTHER)
Admission: RE | Disposition: A | Discharge status: Home / Self Care | Payer: Self-pay | Source: Home / Self Care | Attending: Obstetrics & Gynecology

## 2019-07-04 ENCOUNTER — Ambulatory Visit (HOSPITAL_BASED_OUTPATIENT_CLINIC_OR_DEPARTMENT_OTHER): Payer: BC Managed Care – PPO | Admitting: Anesthesiology

## 2019-07-04 DIAGNOSIS — N939 Abnormal uterine and vaginal bleeding, unspecified: Secondary | ICD-10-CM | POA: Diagnosis not present

## 2019-07-04 DIAGNOSIS — Z87891 Personal history of nicotine dependence: Secondary | ICD-10-CM | POA: Diagnosis not present

## 2019-07-04 DIAGNOSIS — D649 Anemia, unspecified: Secondary | ICD-10-CM | POA: Diagnosis not present

## 2019-07-04 DIAGNOSIS — Z888 Allergy status to other drugs, medicaments and biological substances status: Secondary | ICD-10-CM | POA: Insufficient documentation

## 2019-07-04 DIAGNOSIS — E039 Hypothyroidism, unspecified: Secondary | ICD-10-CM | POA: Insufficient documentation

## 2019-07-04 DIAGNOSIS — F419 Anxiety disorder, unspecified: Secondary | ICD-10-CM | POA: Insufficient documentation

## 2019-07-04 DIAGNOSIS — M5116 Intervertebral disc disorders with radiculopathy, lumbar region: Secondary | ICD-10-CM | POA: Diagnosis not present

## 2019-07-04 DIAGNOSIS — K219 Gastro-esophageal reflux disease without esophagitis: Secondary | ICD-10-CM | POA: Diagnosis not present

## 2019-07-04 DIAGNOSIS — G2581 Restless legs syndrome: Secondary | ICD-10-CM | POA: Insufficient documentation

## 2019-07-04 DIAGNOSIS — D259 Leiomyoma of uterus, unspecified: Secondary | ICD-10-CM | POA: Diagnosis not present

## 2019-07-04 DIAGNOSIS — F329 Major depressive disorder, single episode, unspecified: Secondary | ICD-10-CM | POA: Diagnosis not present

## 2019-07-04 DIAGNOSIS — N840 Polyp of corpus uteri: Secondary | ICD-10-CM | POA: Diagnosis not present

## 2019-07-04 DIAGNOSIS — Z6841 Body Mass Index (BMI) 40.0 and over, adult: Secondary | ICD-10-CM | POA: Diagnosis not present

## 2019-07-04 DIAGNOSIS — Z79899 Other long term (current) drug therapy: Secondary | ICD-10-CM | POA: Diagnosis not present

## 2019-07-04 DIAGNOSIS — Z79890 Hormone replacement therapy (postmenopausal): Secondary | ICD-10-CM | POA: Diagnosis not present

## 2019-07-04 HISTORY — DX: Other chronic pain: G89.29

## 2019-07-04 HISTORY — PX: DILITATION & CURRETTAGE/HYSTROSCOPY WITH HYDROTHERMAL ABLATION: SHX5570

## 2019-07-04 HISTORY — PX: DILATATION & CURETTAGE/HYSTEROSCOPY WITH MYOSURE: SHX6511

## 2019-07-04 SURGERY — DILATATION & CURETTAGE/HYSTEROSCOPY WITH HYDROTHERMAL ABLATION
Anesthesia: General | Site: Vagina

## 2019-07-04 MED ORDER — SCOPOLAMINE 1 MG/3DAYS TD PT72
MEDICATED_PATCH | TRANSDERMAL | Status: AC
  Filled 2019-07-04: qty 1

## 2019-07-04 MED ORDER — LIDOCAINE 2% (20 MG/ML) 5 ML SYRINGE
INTRAMUSCULAR | Status: AC
  Filled 2019-07-04: qty 5

## 2019-07-04 MED ORDER — FENTANYL CITRATE (PF) 100 MCG/2ML IJ SOLN
INTRAMUSCULAR | Status: AC
  Filled 2019-07-04: qty 2

## 2019-07-04 MED ORDER — LIDOCAINE-EPINEPHRINE 1 %-1:100000 IJ SOLN
INTRAMUSCULAR | Status: DC | PRN
  Administered 2019-07-04: 20 mL

## 2019-07-04 MED ORDER — MEPERIDINE HCL 25 MG/ML IJ SOLN: 6.2500 mg | INTRAMUSCULAR | Status: DC | PRN

## 2019-07-04 MED ORDER — LACTATED RINGERS IV SOLN: INTRAVENOUS | Status: DC

## 2019-07-04 MED ORDER — OXYCODONE HCL 5 MG PO TABS
5.0000 mg | ORAL_TABLET | Freq: Once | ORAL | Status: AC | PRN
  Administered 2019-07-04: 5 mg via ORAL

## 2019-07-04 MED ORDER — EPHEDRINE 5 MG/ML INJ
INTRAVENOUS | Status: AC
  Filled 2019-07-04: qty 10

## 2019-07-04 MED ORDER — FENTANYL CITRATE (PF) 100 MCG/2ML IJ SOLN
INTRAMUSCULAR | Status: DC | PRN
  Administered 2019-07-04: 25 ug via INTRAVENOUS
  Administered 2019-07-04: 50 ug via INTRAVENOUS
  Administered 2019-07-04: 25 ug via INTRAVENOUS

## 2019-07-04 MED ORDER — ONDANSETRON HCL 4 MG/2ML IJ SOLN
INTRAMUSCULAR | Status: DC | PRN
  Administered 2019-07-04: 4 mg via INTRAVENOUS

## 2019-07-04 MED ORDER — DEXAMETHASONE SODIUM PHOSPHATE 4 MG/ML IJ SOLN
INTRAMUSCULAR | Status: DC | PRN
  Administered 2019-07-04: 10 mg via INTRAVENOUS

## 2019-07-04 MED ORDER — DEXAMETHASONE SODIUM PHOSPHATE 10 MG/ML IJ SOLN
INTRAMUSCULAR | Status: AC
  Filled 2019-07-04: qty 1

## 2019-07-04 MED ORDER — OXYCODONE HCL 5 MG PO TABS
ORAL_TABLET | ORAL | Status: AC
  Filled 2019-07-04: qty 1

## 2019-07-04 MED ORDER — OXYCODONE HCL 5 MG/5ML PO SOLN: 5.0000 mg | Freq: Once | ORAL | Status: AC | PRN

## 2019-07-04 MED ORDER — LIDOCAINE-EPINEPHRINE 1 %-1:100000 IJ SOLN
INTRAMUSCULAR | Status: AC
  Filled 2019-07-04: qty 1

## 2019-07-04 MED ORDER — LIDOCAINE 2% (20 MG/ML) 5 ML SYRINGE
INTRAMUSCULAR | Status: DC | PRN
  Administered 2019-07-04: 60 mg via INTRAVENOUS

## 2019-07-04 MED ORDER — SILVER NITRATE-POT NITRATE 75-25 % EX MISC
CUTANEOUS | Status: AC
  Filled 2019-07-04: qty 10

## 2019-07-04 MED ORDER — MIDAZOLAM HCL 5 MG/5ML IJ SOLN
INTRAMUSCULAR | Status: DC | PRN
  Administered 2019-07-04: 2 mg via INTRAVENOUS

## 2019-07-04 MED ORDER — PROPOFOL 10 MG/ML IV BOLUS
INTRAVENOUS | Status: DC | PRN
  Administered 2019-07-04: 180 mg via INTRAVENOUS

## 2019-07-04 MED ORDER — MIDAZOLAM HCL 2 MG/2ML IJ SOLN
INTRAMUSCULAR | Status: AC
  Filled 2019-07-04: qty 2

## 2019-07-04 MED ORDER — SODIUM CHLORIDE 0.9 % IR SOLN
Status: DC | PRN
  Administered 2019-07-04: 3000 mL

## 2019-07-04 MED ORDER — HYDROMORPHONE HCL 1 MG/ML IJ SOLN: 0.2500 mg | INTRAMUSCULAR | Status: DC | PRN

## 2019-07-04 MED ORDER — EPHEDRINE SULFATE-NACL 50-0.9 MG/10ML-% IV SOSY
PREFILLED_SYRINGE | INTRAVENOUS | Status: DC | PRN
  Administered 2019-07-04: 10 mg via INTRAVENOUS

## 2019-07-04 MED ORDER — ONDANSETRON HCL 4 MG/2ML IJ SOLN
INTRAMUSCULAR | Status: AC
  Filled 2019-07-04: qty 2

## 2019-07-04 MED ORDER — PROPOFOL 10 MG/ML IV BOLUS
INTRAVENOUS | Status: AC
  Filled 2019-07-04: qty 20

## 2019-07-04 MED ORDER — PROMETHAZINE HCL 25 MG/ML IJ SOLN: 6.2500 mg | INTRAMUSCULAR | Status: DC | PRN

## 2019-07-04 SURGICAL SUPPLY — 23 items
CATH ROBINSON RED A/P 16FR (CATHETERS) ×2 IMPLANT
CONTAINER PREFILL 10% NBF 60ML (FORM) ×2 IMPLANT
DEVICE MYOSURE LITE (MISCELLANEOUS) ×2 IMPLANT
DILATOR CANAL MILEX (MISCELLANEOUS) IMPLANT
GAUZE 4X4 16PLY RFD (DISPOSABLE) ×2 IMPLANT
GLOVE BIOGEL PI IND STRL 6 (GLOVE) ×1 IMPLANT
GLOVE BIOGEL PI IND STRL 6.5 (GLOVE) ×2 IMPLANT
GLOVE BIOGEL PI IND STRL 7.0 (GLOVE) ×2 IMPLANT
GLOVE BIOGEL PI INDICATOR 6 (GLOVE) ×1
GLOVE BIOGEL PI INDICATOR 6.5 (GLOVE) ×2
GLOVE BIOGEL PI INDICATOR 7.0 (GLOVE) ×2
GLOVE ECLIPSE 6.5 STRL STRAW (GLOVE) ×4 IMPLANT
GLOVE SURG SS PI 7.0 STRL IVOR (GLOVE) ×2 IMPLANT
GOWN STRL REUS W/ TWL LRG LVL3 (GOWN DISPOSABLE) ×1 IMPLANT
GOWN STRL REUS W/TWL LRG LVL3 (GOWN DISPOSABLE) ×3 IMPLANT
KIT PROCEDURE FLUENT (KITS) ×2 IMPLANT
PACK VAGINAL MINOR WOMEN LF (CUSTOM PROCEDURE TRAY) ×2 IMPLANT
PAD OB MATERNITY 4.3X12.25 (PERSONAL CARE ITEMS) ×2 IMPLANT
PAD PREP 24X48 CUFFED NSTRL (MISCELLANEOUS) ×2 IMPLANT
SEAL ROD LENS SCOPE MYOSURE (ABLATOR) ×2 IMPLANT
SET GENESYS HTA PROCERVA (MISCELLANEOUS) ×2 IMPLANT
SLEEVE SCD COMPRESS KNEE MED (MISCELLANEOUS) ×4 IMPLANT
TOWEL GREEN STERILE FF (TOWEL DISPOSABLE) ×2 IMPLANT

## 2019-07-04 NOTE — Interval H&P Note (Signed)
History and Physical Interval Note:  07/04/2019 12:26 PM  [name]  has presented today for surgery, with the diagnosis of N93.9 abnormal uterine bleeding N84.0 Uterine polyp.  The various methods of treatment have been discussed with the patient and family. After consideration of risks, benefits and other options for treatment, the patient has consented to  Procedure(s) with comments: DILATATION & CURETTAGE/HYSTEROSCOPY WITH HYDROTHERMAL ABLATION AND MYOSURE (N/A) - myosure rep will be here confirmed on 06/29/19 CS as a surgical intervention.  The patient's history has been reviewed, patient examined, no change in status, stable for surgery.  I have reviewed the patient's chart and labs.  Questions were answered to the patient's satisfaction.     [name]

## 2019-07-04 NOTE — Anesthesia Postprocedure Evaluation (Signed)
Anesthesia Post Note  Patient: Audiological scientist  Procedure(s) Performed: DILATATION & CURETTAGE/HYSTEROSCOPY WITH HYDROTHERMAL ABLATION (N/A Vagina ) AND MYOSURE (N/A Vagina )     Patient location during evaluation: PACU Anesthesia Type: General Level of consciousness: awake and alert Pain management: pain level controlled Vital Signs Assessment: post-procedure vital signs reviewed and stable Respiratory status: spontaneous breathing, nonlabored ventilation and respiratory function stable Cardiovascular status: blood pressure returned to baseline and stable Postop Assessment: no apparent nausea or vomiting Anesthetic complications: no    Last Vitals:  Vitals:   07/04/19 1400 07/04/19 1423  BP: (!) 149/72 (!) 146/74  Pulse: 88 67  Resp: 17 16  Temp:  36.9 C  SpO2: 98% 100%    Last Pain:  Vitals:   07/04/19 1423  TempSrc: Oral  PainSc: [place]

## 2019-07-04 NOTE — Anesthesia Procedure Notes (Signed)
Procedure Name: LMA Insertion Date/Time: 07/04/2019 12:50 PM Performed by: Lieutenant Diego, CRNA Pre-anesthesia Checklist: Patient identified, Emergency Drugs available, Suction available and Patient being monitored Patient Re-evaluated:Patient Re-evaluated prior to induction Oxygen Delivery Method: Circle system utilized Preoxygenation: Pre-oxygenation with 100% oxygen Induction Type: IV induction Ventilation: Mask ventilation without difficulty LMA: LMA inserted LMA Size: 4.0 Number of attempts: 1 Placement Confirmation: positive ETCO2 and breath sounds checked- equal and bilateral Tube secured with: Tape Dental Injury: Teeth and Oropharynx as per pre-operative assessment

## 2019-07-04 NOTE — Anesthesia Preprocedure Evaluation (Signed)
Anesthesia Evaluation  Patient identified by MRN, date of birth, ID band Patient awake    Reviewed: Allergy & Precautions, NPO status , Patient's Chart, lab work & pertinent test results  History of Anesthesia Complications (+) PONV  Airway Mallampati: III  TM Distance: >3 FB Neck ROM: Full    Dental  (+) Teeth Intact, Dental Advisory Given   Pulmonary neg pulmonary ROS,    breath sounds clear to auscultation       Cardiovascular negative cardio ROS   Rhythm:Regular Rate:Normal     Neuro/Psych  Headaches, Anxiety    GI/Hepatic Neg liver ROS, GERD  Medicated,  Endo/Other  Morbid obesity  Renal/GU negative Renal ROS     Musculoskeletal   Abdominal (+) + obese,   Peds  Hematology  (+) anemia , hgb 10.4   Anesthesia Other Findings   Reproductive/Obstetrics                             Anesthesia Physical  Anesthesia Plan  ASA: III  Anesthesia Plan: General   Post-op Pain Management:    Induction: Intravenous  PONV Risk Score and Plan: 4 or greater and Ondansetron, Dexamethasone, Midazolam and Scopolamine patch - Pre-op  Airway Management Planned: LMA  Additional Equipment:   Intra-op Plan:   Post-operative Plan: Extubation in OR  Informed Consent: I have reviewed the patients History and Physical, chart, labs and discussed the procedure including the risks, benefits and alternatives for the proposed anesthesia with the patient or authorized representative who has indicated his/her understanding and acceptance.     Dental advisory given  Plan Discussed with: CRNA  Anesthesia Plan Comments:         Anesthesia Quick Evaluation

## 2019-07-04 NOTE — Transfer of Care (Signed)
Immediate Anesthesia Transfer of Care Note  Patient: [name]  Procedure(s) Performed: DILATATION & CURETTAGE/HYSTEROSCOPY WITH HYDROTHERMAL ABLATION (N/A Vagina ) AND MYOSURE (N/A Vagina )  Patient Location: PACU  Anesthesia Type:General  Level of Consciousness: awake  Airway & Oxygen Therapy: Patient Spontanous Breathing and Patient connected to face mask oxygen  Post-op Assessment: Report given to RN and Post -op Vital signs reviewed and stable  Post vital signs: Reviewed and stable  Last Vitals:  Vitals Value Taken Time  BP    Temp    Pulse 102 07/04/19 1338  Resp 21 07/04/19 1338  SpO2 100 % 07/04/19 1338  Vitals shown include unvalidated device data.  Last Pain:  Vitals:   07/04/19 1119  TempSrc: Tympanic  PainSc: 0-No pain      Patients Stated Pain Goal: 6 (93/57/01 7793)  Complications: No apparent anesthesia complications

## 2019-07-04 NOTE — Discharge Instructions (Addendum)
HOME INSTRUCTIONS ° °Please note any unusual or excessive bleeding, pain, swelling. Mild dizziness or drowsiness are normal for about 24 hours after surgery. °  °Shower when comfortable ° °Restrictions: No driving for 24 hours or while taking pain medications. ° °Activity:  No heavy lifting (> 10 lbs), nothing in vagina (no tampons, douching, or intercourse) x 2 weeks; no tub baths for 2 weeks °Vaginal spotting is expected but if your bleeding is heavy, period like,  please call the office °  °Diet:  You may return to your regular diet.  Do not eat large meals.  Eat small frequent meals throughout the day.  Continue to drink a good amount of water at least 6-8 glasses of water per day, hydration is very important for the healing process. ° °Pain Management: Take Motrin and/or tylenol as needed for pain. ° °Always take prescription pain medication with food, it may cause constipation, increase fluids and fiber and you may want to take an over-the-counter stool softener like Colace as needed up to 2x a day.   ° °Alcohol -- Avoid for 24 hours and while taking pain medications. ° °Nausea: Take sips of ginger ale or soda ° °Fever -- Call physician if temperature over 101 degrees ° °Follow up:  If you do not already have a follow up appointment scheduled, please call the office at [phone].  If you experience fever (a temperature greater than 100.4), pain unrelieved by pain medication, shortness of breath, swelling of a single leg, or any other symptoms which are concerning to you please the office immediately. ° ° °Post Anesthesia Home Care Instructions ° °Activity: °Get plenty of rest for the remainder of the day. A responsible individual must stay with you for 24 hours following the procedure.  °For the next 24 hours, DO NOT: °-Drive a car °-Operate machinery °-Drink alcoholic beverages °-Take any medication unless instructed by your physician °-Make any legal decisions or sign important papers. ° °Meals: °Start  with liquid foods such as gelatin or soup. Progress to regular foods as tolerated. Avoid greasy, spicy, heavy foods. If nausea and/or vomiting occur, drink only clear liquids until the nausea and/or vomiting subsides. Call your physician if vomiting continues. ° °Special Instructions/Symptoms: °Your throat may feel dry or sore from the anesthesia or the breathing tube placed in your throat during surgery. If this causes discomfort, gargle with warm salt water. The discomfort should disappear within 24 hours. ° °If you had a scopolamine patch placed behind your ear for the management of post- operative nausea and/or vomiting: ° °1. The medication in the patch is effective for 72 hours, after which it should be removed.  Wrap patch in a tissue and discard in the trash. Wash hands thoroughly with soap and water. °2. You may remove the patch earlier than 72 hours if you experience unpleasant side effects which may include dry mouth, dizziness or visual disturbances. °3. Avoid touching the patch. Wash your hands with soap and water after contact with the patch. °   ° °

## 2019-07-04 NOTE — Op Note (Signed)
Operative Report  PreOp: 1) Abnormal uterine bleeding 2) uterine mass PostOp: 1) AUB 2) suspected uterine fibroid Procedure:  Hysteroscopy, Dilation and Curettage, Myosure myomectomy, Endometrial ablation Surgeon: Dr. Janyth Pupa Anesthesia: General, cervical block Complications:none EBL: 5cc UOP: 50cc IVF:700cc Discrepancy: 75cc  Findings: 8cm anteverted uterus, large ~ 2.5cm intracavitary fibroid, proliferative endometrium, both ostia visualized Specimens: 1) endometrial curettngs 2) uterine fibroid  Procedure: The patient was taken to the operating room where she underwent general anesthesia without difficulty. The patient was placed in a low lithotomy position using Allen stirrups. The patient was examined with the findings as noted above.  She was then prepped and draped in the normal sterile fashion. The bladder was drained using a red rubber urethral catheter. A sterile speculum was inserted into the vagina. A single tooth tenaculum was placed on the anterior lip of the cervix. 20cc of Lidocaine with epinephrine was injected into the cervix approximately 5cc at 10,2, 4 and 7 o'clock position.  The uterus was then sounded to 8cm. The endocervical canal was then serially dilated using Hank dilators.  The diagnostic hysteroscope was then inserted without difficulty and noted to have the findings as listed above. Visualization was achieved using NS as a distending medium. The myosure was inserted and sharp resection of the uterine fibriod was completed.  The hysteroscope was removed and sharp curettage was performed. The tissue was sent to pathology.   Attention was then turned to the HTA system. The system was set up according to manufacture instructions. Cavity assessment was performed and passed. Ablation was completed under direct visualization.  No uterine perforation was seen. All instrument were then removed. Hemostasis was observed at the cervical site.  The patient was repositioned  to the supine position. The patient tolerated the procedure without any complications and taken to recovery in stable condition.   Janyth Pupa, DO [phone] (pager) [phone] (office)

## 2019-07-05 ENCOUNTER — Encounter: Payer: Self-pay | Admitting: *Deleted

## 2019-07-05 LAB — SURGICAL PATHOLOGY

## 2019-07-05 NOTE — Addendum Note (Signed)
Addendum  created 07/05/19 1227 by Tawni Millers, CRNA   Charge Capture section accepted

## 2019-08-02 DIAGNOSIS — M5116 Intervertebral disc disorders with radiculopathy, lumbar region: Secondary | ICD-10-CM | POA: Diagnosis not present

## 2019-10-20 DIAGNOSIS — J019 Acute sinusitis, unspecified: Secondary | ICD-10-CM | POA: Diagnosis not present

## 2019-11-09 DIAGNOSIS — M5116 Intervertebral disc disorders with radiculopathy, lumbar region: Secondary | ICD-10-CM | POA: Diagnosis not present

## 2019-11-09 DIAGNOSIS — F325 Major depressive disorder, single episode, in full remission: Secondary | ICD-10-CM | POA: Diagnosis not present

## 2019-11-09 DIAGNOSIS — G894 Chronic pain syndrome: Secondary | ICD-10-CM | POA: Diagnosis not present

## 2019-11-09 DIAGNOSIS — F419 Anxiety disorder, unspecified: Secondary | ICD-10-CM | POA: Diagnosis not present

## 2020-01-05 DIAGNOSIS — J019 Acute sinusitis, unspecified: Secondary | ICD-10-CM | POA: Diagnosis not present

## 2020-02-28 DIAGNOSIS — F419 Anxiety disorder, unspecified: Secondary | ICD-10-CM | POA: Diagnosis not present

## 2020-02-28 DIAGNOSIS — G894 Chronic pain syndrome: Secondary | ICD-10-CM | POA: Diagnosis not present

## 2020-02-28 DIAGNOSIS — F325 Major depressive disorder, single episode, in full remission: Secondary | ICD-10-CM | POA: Diagnosis not present

## 2020-03-07 DIAGNOSIS — Z9189 Other specified personal risk factors, not elsewhere classified: Secondary | ICD-10-CM | POA: Diagnosis not present

## 2020-03-07 DIAGNOSIS — Z20822 Contact with and (suspected) exposure to covid-19: Secondary | ICD-10-CM | POA: Diagnosis not present

## 2020-05-29 DIAGNOSIS — G894 Chronic pain syndrome: Secondary | ICD-10-CM | POA: Diagnosis not present

## 2020-05-29 DIAGNOSIS — F419 Anxiety disorder, unspecified: Secondary | ICD-10-CM | POA: Diagnosis not present

## 2020-05-29 DIAGNOSIS — M5116 Intervertebral disc disorders with radiculopathy, lumbar region: Secondary | ICD-10-CM | POA: Diagnosis not present

## 2020-05-29 DIAGNOSIS — F325 Major depressive disorder, single episode, in full remission: Secondary | ICD-10-CM | POA: Diagnosis not present

## 2020-09-16 DIAGNOSIS — Z13220 Encounter for screening for lipoid disorders: Secondary | ICD-10-CM | POA: Diagnosis not present

## 2020-09-16 DIAGNOSIS — M5116 Intervertebral disc disorders with radiculopathy, lumbar region: Secondary | ICD-10-CM | POA: Diagnosis not present

## 2020-09-16 DIAGNOSIS — Z131 Encounter for screening for diabetes mellitus: Secondary | ICD-10-CM | POA: Diagnosis not present

## 2020-09-16 DIAGNOSIS — G894 Chronic pain syndrome: Secondary | ICD-10-CM | POA: Diagnosis not present

## 2020-12-02 DIAGNOSIS — M5116 Intervertebral disc disorders with radiculopathy, lumbar region: Secondary | ICD-10-CM | POA: Diagnosis not present

## 2020-12-21 DIAGNOSIS — J069 Acute upper respiratory infection, unspecified: Secondary | ICD-10-CM | POA: Diagnosis not present

## 2021-03-05 DIAGNOSIS — F325 Major depressive disorder, single episode, in full remission: Secondary | ICD-10-CM | POA: Diagnosis not present

## 2021-03-05 DIAGNOSIS — M5116 Intervertebral disc disorders with radiculopathy, lumbar region: Secondary | ICD-10-CM | POA: Diagnosis not present

## 2021-03-05 DIAGNOSIS — Z6841 Body Mass Index (BMI) 40.0 and over, adult: Secondary | ICD-10-CM | POA: Diagnosis not present

## 2021-06-06 ENCOUNTER — Ambulatory Visit: Payer: Self-pay

## 2021-06-17 DIAGNOSIS — M5116 Intervertebral disc disorders with radiculopathy, lumbar region: Secondary | ICD-10-CM | POA: Diagnosis not present

## 2021-06-17 DIAGNOSIS — E039 Hypothyroidism, unspecified: Secondary | ICD-10-CM | POA: Diagnosis not present

## 2021-06-17 DIAGNOSIS — G4700 Insomnia, unspecified: Secondary | ICD-10-CM | POA: Diagnosis not present

## 2021-07-26 DIAGNOSIS — H60392 Other infective otitis externa, left ear: Secondary | ICD-10-CM | POA: Diagnosis not present

## 2021-09-17 DIAGNOSIS — Z13220 Encounter for screening for lipoid disorders: Secondary | ICD-10-CM | POA: Diagnosis not present

## 2021-09-17 DIAGNOSIS — M5116 Intervertebral disc disorders with radiculopathy, lumbar region: Secondary | ICD-10-CM | POA: Diagnosis not present

## 2021-09-17 DIAGNOSIS — Z6841 Body Mass Index (BMI) 40.0 and over, adult: Secondary | ICD-10-CM | POA: Diagnosis not present

## 2021-09-17 DIAGNOSIS — Z131 Encounter for screening for diabetes mellitus: Secondary | ICD-10-CM | POA: Diagnosis not present

## 2021-12-26 DIAGNOSIS — M5116 Intervertebral disc disorders with radiculopathy, lumbar region: Secondary | ICD-10-CM | POA: Diagnosis not present

## 2022-03-30 DIAGNOSIS — M5116 Intervertebral disc disorders with radiculopathy, lumbar region: Secondary | ICD-10-CM | POA: Diagnosis not present

## 2022-03-30 DIAGNOSIS — Z23 Encounter for immunization: Secondary | ICD-10-CM | POA: Diagnosis not present

## 2022-05-02 DIAGNOSIS — J0100 Acute maxillary sinusitis, unspecified: Secondary | ICD-10-CM | POA: Diagnosis not present

## 2022-06-29 DIAGNOSIS — M5116 Intervertebral disc disorders with radiculopathy, lumbar region: Secondary | ICD-10-CM | POA: Diagnosis not present

## 2022-09-21 DIAGNOSIS — M5116 Intervertebral disc disorders with radiculopathy, lumbar region: Secondary | ICD-10-CM | POA: Diagnosis not present

## 2022-09-21 DIAGNOSIS — E7800 Pure hypercholesterolemia, unspecified: Secondary | ICD-10-CM | POA: Diagnosis not present

## 2022-09-21 DIAGNOSIS — G43909 Migraine, unspecified, not intractable, without status migrainosus: Secondary | ICD-10-CM | POA: Diagnosis not present

## 2022-09-21 DIAGNOSIS — Z131 Encounter for screening for diabetes mellitus: Secondary | ICD-10-CM | POA: Diagnosis not present

## 2022-09-21 DIAGNOSIS — G4700 Insomnia, unspecified: Secondary | ICD-10-CM | POA: Diagnosis not present

## 2022-09-21 DIAGNOSIS — E039 Hypothyroidism, unspecified: Secondary | ICD-10-CM | POA: Diagnosis not present

## 2022-12-28 DIAGNOSIS — G4700 Insomnia, unspecified: Secondary | ICD-10-CM | POA: Diagnosis not present

## 2022-12-28 DIAGNOSIS — M5116 Intervertebral disc disorders with radiculopathy, lumbar region: Secondary | ICD-10-CM | POA: Diagnosis not present

## 2022-12-28 DIAGNOSIS — E7800 Pure hypercholesterolemia, unspecified: Secondary | ICD-10-CM | POA: Diagnosis not present

## 2023-02-13 ENCOUNTER — Telehealth: Payer: BC Managed Care – PPO | Admitting: Nurse Practitioner

## 2023-02-13 DIAGNOSIS — J329 Chronic sinusitis, unspecified: Secondary | ICD-10-CM | POA: Diagnosis not present

## 2023-02-13 DIAGNOSIS — B9789 Other viral agents as the cause of diseases classified elsewhere: Secondary | ICD-10-CM | POA: Diagnosis not present

## 2023-02-14 ENCOUNTER — Encounter: Payer: Self-pay | Admitting: Nurse Practitioner

## 2023-02-14 NOTE — Progress Notes (Signed)
I have spent 5 minutes in review of e-visit questionnaire, review and updating patient chart, medical decision making and response to patient.  ° °Zelda W Fleming, NP ° °  °

## 2023-02-14 NOTE — Progress Notes (Signed)
E-Visit for Sinus Problems   Providers prescribe antibiotics to treat infections caused by bacteria. Antibiotics are very powerful in treating bacterial infections when they are used properly. To maintain their effectiveness, they should be used only when necessary. Overuse of antibiotics has resulted in the development of superbugs that are resistant to treatment!    After careful review of your answers, I would not recommend an antibiotic for your condition.  Antibiotics are not effective against viruses and therefore should not be used to treat them. Common examples of infections caused by viruses include colds and flu. Please feel free to reach back out to [place] after 5 days if your symptoms continue or have worsened.    We are sorry that you are not feeling well.  Here is how we plan to help!  Based on what you have shared with me it looks like you have sinusitis.  Sinusitis is inflammation and infection in the sinus cavities of the head.  Based on your presentation I believe you most likely have Acute Viral Sinusitis.This is an infection most likely caused by a virus. There is not specific treatment for viral sinusitis other than to help you with the symptoms until the infection runs its course.  You may use an oral decongestant such as Mucinex D or if you have glaucoma or high blood pressure use plain Mucinex. Saline nasal spray help and can safely be used as often as needed for congestion, I would recommend you continue to use your nasal sprays and decongestants as well as humidifier at this time. I have also provided a work note in Pharmacologist if needed.   Some authorities believe that zinc sprays or the use of Echinacea may shorten the course of your symptoms.  Sinus infections are not as easily transmitted as other respiratory infection, however we still recommend that you avoid close contact with loved ones, especially the very young and elderly.  Remember to wash your hands thoroughly  throughout the day as this is the number one way to prevent the spread of infection!  Home Care: Only take medications as instructed by your medical team. Do not take these medications with alcohol. A steam or ultrasonic humidifier can help congestion.  You can place a towel over your head and breathe in the steam from hot water coming from a faucet. Avoid close contacts especially the very young and the elderly. Cover your mouth when you cough or sneeze. Always remember to wash your hands.  Get Help Right Away If: You develop worsening fever or sinus pain. You develop a severe head ache or visual changes. Your symptoms persist after you have completed your treatment plan.  Make sure you Understand these instructions. Will watch your condition. Will get help right away if you are not doing well or get worse.   Thank you for choosing an e-visit.  Your e-visit answers were reviewed by a board certified advanced clinical practitioner to complete your personal care plan. Depending upon the condition, your plan could have included both over the counter or prescription medications.  Please review your pharmacy choice. Make sure the pharmacy is open so you can pick up prescription now. If there is a problem, you may contact your provider through [employer] and have the prescription routed to another pharmacy.  Your safety is important to [place]. If you have drug allergies check your prescription carefully.   For the next 24 hours you can use MyChart to ask questions about today's visit, request  a non-urgent call back, or ask for a work or school excuse. You will get an email in the next two days asking about your experience. I hope that your e-visit has been valuable and will speed your recovery.

## 2023-02-16 ENCOUNTER — Other Ambulatory Visit (HOSPITAL_BASED_OUTPATIENT_CLINIC_OR_DEPARTMENT_OTHER): Payer: Self-pay | Admitting: Family Medicine

## 2023-02-16 DIAGNOSIS — R161 Splenomegaly, not elsewhere classified: Secondary | ICD-10-CM

## 2023-02-21 ENCOUNTER — Ambulatory Visit (HOSPITAL_BASED_OUTPATIENT_CLINIC_OR_DEPARTMENT_OTHER)
Admission: RE | Admit: 2023-02-21 | Discharge: 2023-02-21 | Disposition: A | Discharge status: Home / Self Care | Payer: BC Managed Care – PPO | Source: Ambulatory Visit | Attending: Family Medicine | Admitting: Family Medicine

## 2023-02-21 DIAGNOSIS — R161 Splenomegaly, not elsewhere classified: Secondary | ICD-10-CM | POA: Diagnosis not present

## 2023-02-21 DIAGNOSIS — R1012 Left upper quadrant pain: Secondary | ICD-10-CM | POA: Diagnosis not present

## 2023-02-23 ENCOUNTER — Ambulatory Visit (HOSPITAL_BASED_OUTPATIENT_CLINIC_OR_DEPARTMENT_OTHER): Payer: BC Managed Care – PPO

## 2023-03-24 DIAGNOSIS — F419 Anxiety disorder, unspecified: Secondary | ICD-10-CM | POA: Diagnosis not present

## 2023-03-24 DIAGNOSIS — G43919 Migraine, unspecified, intractable, without status migrainosus: Secondary | ICD-10-CM | POA: Diagnosis not present

## 2023-03-24 DIAGNOSIS — M5116 Intervertebral disc disorders with radiculopathy, lumbar region: Secondary | ICD-10-CM | POA: Diagnosis not present

## 2023-03-24 DIAGNOSIS — G4700 Insomnia, unspecified: Secondary | ICD-10-CM | POA: Diagnosis not present

## 2023-06-24 DIAGNOSIS — M5116 Intervertebral disc disorders with radiculopathy, lumbar region: Secondary | ICD-10-CM | POA: Diagnosis not present

## 2023-08-14 ENCOUNTER — Encounter (HOSPITAL_BASED_OUTPATIENT_CLINIC_OR_DEPARTMENT_OTHER): Payer: Self-pay | Admitting: *Deleted

## 2023-08-14 ENCOUNTER — Emergency Department (HOSPITAL_BASED_OUTPATIENT_CLINIC_OR_DEPARTMENT_OTHER): Payer: BC Managed Care – PPO

## 2023-08-14 ENCOUNTER — Emergency Department (HOSPITAL_BASED_OUTPATIENT_CLINIC_OR_DEPARTMENT_OTHER)
Admission: EM | Admit: 2023-08-14 | Discharge: 2023-08-14 | Disposition: A | Discharge status: Home / Self Care | Payer: BC Managed Care – PPO | Attending: Emergency Medicine | Admitting: Emergency Medicine

## 2023-08-14 ENCOUNTER — Other Ambulatory Visit: Payer: Self-pay

## 2023-08-14 DIAGNOSIS — M545 Low back pain, unspecified: Secondary | ICD-10-CM | POA: Diagnosis present

## 2023-08-14 DIAGNOSIS — R109 Unspecified abdominal pain: Secondary | ICD-10-CM | POA: Insufficient documentation

## 2023-08-14 LAB — COMPREHENSIVE METABOLIC PANEL
ALT: 15 U/L (ref 0–44)
AST: 16 U/L (ref 15–41)
Albumin: 4.3 g/dL (ref 3.5–5.0)
Alkaline Phosphatase: 70 U/L (ref 38–126)
Anion gap: 10 (ref 5–15)
BUN: <5 mg/dL — ABNORMAL LOW (ref 6–20)
CO2: 27 mmol/L (ref 22–32)
Calcium: 8.7 mg/dL — ABNORMAL LOW (ref 8.9–10.3)
Chloride: 103 mmol/L (ref 98–111)
Creatinine, Ser: 0.71 mg/dL (ref 0.44–1.00)
GFR, Estimated: >60 mL/min (ref >60)
Glucose, Bld: 91 mg/dL (ref 70–99)
Potassium: 3.8 mmol/L (ref 3.5–5.1)
Sodium: 140 mmol/L (ref 135–145)
Total Bilirubin: 0.4 mg/dL (ref 0.0–1.2)
Total Protein: 7.3 g/dL (ref 6.5–8.1)

## 2023-08-14 LAB — URINALYSIS, ROUTINE W REFLEX MICROSCOPIC
Bacteria, UA: NONE SEEN
Bilirubin Urine: NEGATIVE
Glucose, UA: NEGATIVE mg/dL
Hgb urine dipstick: NEGATIVE
Ketones, ur: NEGATIVE mg/dL
Leukocytes,Ua: NEGATIVE
Nitrite: NEGATIVE
Protein, ur: NEGATIVE mg/dL
Specific Gravity, Urine: 1.009 (ref 1.005–1.030)
pH: 7 (ref 5.0–8.0)

## 2023-08-14 LAB — CBC
HCT: 38.7 % (ref 36.0–46.0)
Hemoglobin: 13.1 g/dL (ref 12.0–15.0)
MCH: 28.5 pg (ref 26.0–34.0)
MCHC: 33.9 g/dL (ref 30.0–36.0)
MCV: 84.3 fL (ref 80.0–100.0)
Platelets: 242 10*3/uL (ref 150–400)
RBC: 4.59 MIL/uL (ref 3.87–5.11)
RDW: 13.2 % (ref 11.5–15.5)
WBC: 7.6 10*3/uL (ref 4.0–10.5)
nRBC: 0 % (ref 0.0–0.2)

## 2023-08-14 LAB — LIPASE, BLOOD: Lipase: 80 U/L — ABNORMAL HIGH (ref 11–51)

## 2023-08-14 MED ORDER — IOHEXOL 300 MG/ML  SOLN
100.0000 mL | Freq: Once | INTRAMUSCULAR | Status: AC | PRN
  Administered 2023-08-14: 100 mL via INTRAVENOUS

## 2023-08-14 MED ORDER — MORPHINE SULFATE (PF) 4 MG/ML IV SOLN
6.0000 mg | Freq: Once | INTRAVENOUS | Status: DC
  Filled 2023-08-14: qty 2

## 2023-08-14 MED ORDER — ONDANSETRON HCL 4 MG/2ML IJ SOLN
4.0000 mg | Freq: Once | INTRAMUSCULAR | Status: DC
  Filled 2023-08-14: qty 2

## 2023-08-14 NOTE — ED Provider Notes (Signed)
EMERGENCY DEPARTMENT AT [facility] Provider Note   CSN: [id number] Arrival date & time: 08/14/23  1634     History Chief Complaint  Patient presents with   Flank Pain    HPI [name] is a 53 y.o. female presenting for acute on chronic left flank pain. Started last night around 4pm and progressive in nature. On chronic pain medications without improvement  Feels malaise. HX of UL/NL in 2014. Has a history of diverticulitis.  Patient's recorded medical, surgical, social, medication list and allergies were reviewed in the Snapshot window as part of the initial history.   Review of Systems   Review of Systems  Constitutional:  Negative for chills and fever.  HENT:  Negative for ear pain and sore throat.   Eyes:  Negative for pain and visual disturbance.  Respiratory:  Negative for cough and shortness of breath.   Cardiovascular:  Negative for chest pain and palpitations.  Gastrointestinal:  Negative for abdominal pain and vomiting.  Genitourinary:  Negative for dysuria and hematuria.  Musculoskeletal:  Positive for back pain. Negative for arthralgias.  Skin:  Negative for color change and rash.  Neurological:  Negative for seizures and syncope.  All other systems reviewed and are negative.   Physical Exam Updated Vital Signs BP 133/85   Pulse 64   Temp 98.7 F (37.1 C) (Oral)   Resp 16   Ht 5\' 2"  (1.575 m)   Wt 97.8 kg   SpO2 99%   BMI 39.44 kg/m  Physical Exam Vitals and nursing note reviewed.  Constitutional:      General: She is not in acute distress.    Appearance: She is well-developed.  HENT:     Head: Normocephalic and atraumatic.  Eyes:     Conjunctiva/sclera: Conjunctivae normal.  Cardiovascular:     Rate and Rhythm: Normal rate and regular rhythm.     Heart sounds: No murmur heard. Pulmonary:     Effort: Pulmonary effort is normal. No respiratory distress.     Breath sounds: Normal breath sounds.  Abdominal:      General: There is no distension.     Palpations: Abdomen is soft.     Tenderness: There is no abdominal tenderness. There is no right CVA tenderness or left CVA tenderness.  Musculoskeletal:        General: No swelling or tenderness. Normal range of motion.     Cervical back: Neck supple.  Skin:    General: Skin is warm and dry.  Neurological:     General: No focal deficit present.     Mental Status: She is alert and oriented to person, place, and time. Mental status is at baseline.     Cranial Nerves: No cranial nerve deficit.      ED Course/ Medical Decision Making/ A&P    Procedures Procedures   Medications Ordered in ED Medications  morphine (PF) 4 MG/ML injection 6 mg (6 mg Intravenous Not Given 08/14/23 1933)  ondansetron (ZOFRAN) injection 4 mg (4 mg Intravenous Patient Refused/Not Given 08/14/23 1934)  iohexol (OMNIPAQUE) 300 MG/ML solution 100 mL (100 mLs Intravenous Contrast Given 08/14/23 1924)   Medical Decision Making:   [name] is a 53 y.o. female who presented to the ED today with left sided flank pain, detailed above.    Additional history discussed with patient's family/caregivers.  Patient placed on continuous vitals and telemetry monitoring while in ED which was reviewed periodically.  Complete initial physical exam performed, notably the patient  was HDS in NAD.     Reviewed and confirmed nursing documentation for past medical history, family history, social history.    Initial Assessment:   With the patient's presentation of flank pain, most likely diagnosis is Urologic pathology (including UL vs UTI vs pyelo) vs MSK etiology. Other diagnoses were considered including (but not limited to) gastroenteritis, colitis, small bowel obstruction, appendicitis, cholecystitis, pancreatitis,  ruptured ectopic pregnancy, PID, ovarian torsion. These are considered less likely due to history of present illness and physical exam findings.   This is most consistent  with an acute life/limb threatening illness complicated by underlying chronic conditions.   Initial Plan:  CBC/ Metabolic Panel  to evaluate for underlying infectious/metabolic etiology for patient's abdominal pain  Lipase to evaluate for pancreatitis  EKG to evaluate for cardiac source of pain  CT Ab/pelvis for detection of urologic over GI etiology for patient's abdominal pain  Urinalysis and repeat physical assessment to evaluate for UTI/Pyelonpehritis  Empiric management of symptoms with escalating pain control and antiemetics as needed.   Initial Study Results:   Laboratory  All laboratory results reviewed without evidence of clinically relevant pathology.    EKG EKG was reviewed independently. Rate, rhythm, axis, intervals all examined and without medically relevant abnormality. ST segments without concerns for elevations.    Radiology All images reviewed independently Agree with radiology report at this time.   CT ABDOMEN PELVIS W CONTRAST Result Date: 08/14/2023 CLINICAL DATA:  Two day history of left flank pain with intermittent nausea EXAM: CT ABDOMEN AND PELVIS WITH CONTRAST TECHNIQUE: Multidetector CT imaging of the abdomen and pelvis was performed using the standard protocol following bolus administration of intravenous contrast. RADIATION DOSE REDUCTION: This exam was performed according to the departmental dose-optimization program which includes automated exposure control, adjustment of the mA and/or kV according to patient size and/or use of iterative reconstruction technique. CONTRAST:  OMNIPAQUE IOHEXOL 300 MG/ML  SOLN COMPARISON:  CT abdomen and pelvis dated 12/29/2017 FINDINGS: Lower chest: No focal consolidation or pulmonary nodule in the lung bases. No pleural effusion or pneumothorax demonstrated. Partially imaged heart size is normal. Hepatobiliary: No focal hepatic lesions. No intra or extrahepatic biliary ductal dilation. Cholecystectomy. Similar surgical clips  along the posteroinferior right hepatic lobe (2:30). Pancreas: No focal lesions or main ductal dilation. Spleen: Normal in size without focal abnormality. Adrenals/Urinary Tract: No adrenal nodules. No suspicious renal mass, calculi or hydronephrosis. No focal bladder wall thickening. Stomach/Bowel: Normal appearance of the stomach. No evidence of bowel wall thickening, distention, or inflammatory changes. Normal appendix. Vascular/Lymphatic: No significant vascular findings are present. No enlarged abdominal or pelvic lymph nodes. Reproductive: No adnexal masses. Other: No free fluid, fluid collection, or free air. Musculoskeletal: No acute or abnormal lytic or blastic osseous lesions. Multilevel degenerative changes of the partially imaged thoracic and lumbar spine. 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 Small fat-containing paraumbilical hernia. IMPRESSION: 1. No acute abdominopelvic findings. 2. Small fat-containing paraumbilical hernia. Electronically Signed   By: Agustin Cree M.D.   On: 08/14/2023 19:57    Final Reassessment and Plan:   Patient history of present illness and physical exam findings are more consistent with musculoskeletal etiology.  She does have worsening grade 1 anterolisthesis.  She would likely require evaluation by spinal team in the outpatient setting.  Referral to spine surgery placed in the outpatient setting for patient to follow-up. Patient is already on a pain contract for symptomatic management outpatient setting.  Discharged without further acute events in the emergency room.  Disposition:  I have considered need for hospitalization, however, considering all of the above, I believe this patient is stable for discharge at this time.  Patient/family educated about specific return precautions for given chief complaint and symptoms.  Patient/family educated about follow-up with PCP/Spine.     Patient/family  expressed understanding of return precautions and need for follow-up. Patient spoken to regarding all imaging and laboratory results and appropriate follow up for these results. All education provided in verbal form with additional information in written form. Time was allowed for answering of patient questions. Patient discharged.    Emergency Department Medication Summary:   Medications  morphine (PF) 4 MG/ML injection 6 mg (6 mg Intravenous Not Given 08/14/23 1933)  ondansetron (ZOFRAN) injection 4 mg (4 mg Intravenous Patient Refused/Not Given 08/14/23 1934)  iohexol (OMNIPAQUE) 300 MG/ML solution 100 mL (100 mLs Intravenous Contrast Given 08/14/23 1924)             Clinical Impression:  1. Acute left-sided low back pain without sciatica      Discharge   Final Clinical Impression(s) / ED Diagnoses Final diagnoses:  Acute left-sided low back pain without sciatica    Rx / DC Orders ED Discharge Orders          Ordered    Ambulatory referral to Spine Surgery        08/14/23 2018              Glyn Ade, MD 08/14/23 2022

## 2023-08-14 NOTE — ED Triage Notes (Addendum)
Pt to ED reporting left flank pain x 2 day with intermittent nausea. No vomiting, diarrhea or fever. "Pressure" also reported in her abd and reports feeling "full," similar to when she had to have her gallbladder removed.

## 2023-10-13 ENCOUNTER — Other Ambulatory Visit: Payer: Self-pay | Admitting: Medical Genetics

## 2024-02-18 ENCOUNTER — Other Ambulatory Visit: Payer: Self-pay

## 2024-02-18 ENCOUNTER — Emergency Department (HOSPITAL_BASED_OUTPATIENT_CLINIC_OR_DEPARTMENT_OTHER)
Admission: EM | Admit: 2024-02-18 | Discharge: 2024-02-18 | Disposition: A | Discharge status: Home / Self Care | Attending: Emergency Medicine | Admitting: Emergency Medicine

## 2024-02-18 ENCOUNTER — Emergency Department (HOSPITAL_BASED_OUTPATIENT_CLINIC_OR_DEPARTMENT_OTHER)

## 2024-02-18 ENCOUNTER — Encounter (HOSPITAL_BASED_OUTPATIENT_CLINIC_OR_DEPARTMENT_OTHER): Payer: Self-pay

## 2024-02-18 DIAGNOSIS — R1012 Left upper quadrant pain: Secondary | ICD-10-CM | POA: Diagnosis present

## 2024-02-18 LAB — COMPREHENSIVE METABOLIC PANEL WITH GFR
ALT: 8 U/L (ref 0–44)
AST: 18 U/L (ref 15–41)
Albumin: 4 g/dL (ref 3.5–5.0)
Alkaline Phosphatase: 77 U/L (ref 38–126)
Anion gap: 14 (ref 5–15)
BUN: 9 mg/dL (ref 6–20)
CO2: 23 mmol/L (ref 22–32)
Calcium: 9 mg/dL (ref 8.9–10.3)
Chloride: 103 mmol/L (ref 98–111)
Creatinine, Ser: 0.7 mg/dL (ref 0.44–1.00)
GFR, Estimated: >60 mL/min (ref >60)
Glucose, Bld: 87 mg/dL (ref 70–99)
Potassium: 3.9 mmol/L (ref 3.5–5.1)
Sodium: 140 mmol/L (ref 135–145)
Total Bilirubin: 0.4 mg/dL (ref 0.0–1.2)
Total Protein: 7.1 g/dL (ref 6.5–8.1)

## 2024-02-18 LAB — URINALYSIS, ROUTINE W REFLEX MICROSCOPIC
Bilirubin Urine: NEGATIVE
Glucose, UA: NEGATIVE mg/dL
Hgb urine dipstick: NEGATIVE
Ketones, ur: NEGATIVE mg/dL
Leukocytes,Ua: NEGATIVE
Nitrite: NEGATIVE
Protein, ur: NEGATIVE mg/dL
Specific Gravity, Urine: <1.005 — ABNORMAL LOW (ref 1.005–1.030)
pH: 7 (ref 5.0–8.0)

## 2024-02-18 LAB — RESP PANEL BY RT-PCR (RSV, FLU A&B, COVID)  RVPGX2
Influenza A by PCR: NEGATIVE
Influenza B by PCR: NEGATIVE
Resp Syncytial Virus by PCR: NEGATIVE
SARS Coronavirus 2 by RT PCR: NEGATIVE

## 2024-02-18 LAB — LIPASE, BLOOD: Lipase: 25 U/L (ref 11–51)

## 2024-02-18 LAB — CBC
HCT: 36.3 % (ref 36.0–46.0)
Hemoglobin: 12.4 g/dL (ref 12.0–15.0)
MCH: 29.5 pg (ref 26.0–34.0)
MCHC: 34.2 g/dL (ref 30.0–36.0)
MCV: 86.4 fL (ref 80.0–100.0)
Platelets: 209 K/uL (ref 150–400)
RBC: 4.2 MIL/uL (ref 3.87–5.11)
RDW: 13 % (ref 11.5–15.5)
WBC: 6.4 K/uL (ref 4.0–10.5)
nRBC: 0 % (ref 0.0–0.2)

## 2024-02-18 MED ORDER — SODIUM CHLORIDE 0.9 % IV BOLUS
1000.0000 mL | Freq: Once | INTRAVENOUS | Status: AC
  Administered 2024-02-18: 1000 mL via INTRAVENOUS

## 2024-02-18 MED ORDER — IOHEXOL 300 MG/ML  SOLN
100.0000 mL | Freq: Once | INTRAMUSCULAR | Status: AC | PRN
  Administered 2024-02-18: 100 mL via INTRAVENOUS

## 2024-02-18 NOTE — ED Provider Notes (Signed)
 [facility] AT [facility] Provider Note   CSN: [id number] Arrival date & time: 02/18/24  1843     Patient presents with: Abdominal Pain   [name] is a 53 y.o. female.   Patient with history of chronic pain, GERD presents today with complaints of abdominal pain. Reports that her symptoms began 3 days ago. Reports initially she was having nausea, vomiting, and diarrhea. Denies any of this since then. She is having regular bowel movements.  Does report history of cholecystectomy.  Denies history of similar symptoms previously.  Does note that she recently went up on her Ozempic.  Denies any hematemesis, hematochezia, or melena.  No urinary symptoms.  Pain is mostly in the epigastric region.  Denies any chest pain or shortness of breath.  No fevers or chills.  The history is provided by the patient. No language interpreter was used.  Abdominal Pain      Prior to Admission medications   Medication Sig Start Date End Date Taking? Authorizing Provider  Acetaminophen -Codeine (TYLENOL  WITH CODEINE #3 PO) Take by mouth.    [provider]  HYDROcodone-ibuprofen (VICOPROFEN) 7.5-200 MG tablet Take 1 tablet by mouth every 8 (eight) hours as needed for moderate pain.    Lindner, Jodi N, RN  levothyroxine (SYNTHROID) 88 MCG tablet Take 88 mcg by mouth daily before breakfast.    [provider]  loratadine (CLARITIN) 10 MG tablet Take 10 mg by mouth daily.    [provider]  ondansetron  (ZOFRAN ) 4 MG tablet Take 1 tablet (4 mg total) by mouth every 8 (eight) hours as needed for nausea or vomiting. 11/08/14   Shuford, Randine, PA-C  rOPINIRole (REQUIP) 0.25 MG tablet Take 0.25 mg by mouth 3 (three) times daily.    [provider]  sertraline (ZOLOFT) 100 MG tablet Take 150 mg by mouth at bedtime.    Lindner, Jodi N, RN  TiZANidine HCl (ZANAFLEX PO) Take by mouth.    [provider]    Allergies: Gabapentin    Review of  Systems  Gastrointestinal:  Positive for abdominal pain.  All other systems reviewed and are negative.   Updated Vital Signs BP 127/75   Pulse 69   Temp 98.5 F (36.9 C) (Oral)   Resp 18   Ht 5' 2 (1.575 m)   Wt 86.2 kg   SpO2 98%   BMI 34.75 kg/m   Physical Exam Vitals and nursing note reviewed.  Constitutional:      General: She is not in acute distress.    Appearance: Normal appearance. She is normal weight. She is not ill-appearing, toxic-appearing or diaphoretic.  HENT:     Head: Normocephalic and atraumatic.  Cardiovascular:     Rate and Rhythm: Normal rate.  Pulmonary:     Effort: Pulmonary effort is normal. No respiratory distress.  Abdominal:     General: Abdomen is flat.     Palpations: Abdomen is soft.     Tenderness: There is abdominal tenderness in the epigastric area and left upper quadrant. There is no guarding or rebound.  Musculoskeletal:        General: Normal range of motion.     Cervical back: Normal range of motion.  Skin:    General: Skin is warm and dry.  Neurological:     General: No focal deficit present.     Mental Status: She is alert.  Psychiatric:        Mood and Affect: Mood normal.  Behavior: Behavior normal.     (all labs ordered are listed, but only abnormal results are displayed) Labs Reviewed  URINALYSIS, ROUTINE W REFLEX MICROSCOPIC - Abnormal; Notable for the following components:      Result Value   Color, Urine COLORLESS (*)    Specific Gravity, Urine <1.005 (*)    All other components within normal limits  RESP PANEL BY RT-PCR (RSV, FLU A&B, COVID)  RVPGX2  LIPASE, BLOOD  COMPREHENSIVE METABOLIC PANEL WITH GFR  CBC    EKG: None  Radiology: CT ABDOMEN PELVIS W CONTRAST Result Date: 02/18/2024 CLINICAL DATA:  Acute nonlocalized abdominal pain. Left upper quadrant pain for 2 days. Nausea, vomiting, and diarrhea on Tuesday. EXAM: CT ABDOMEN AND PELVIS WITH CONTRAST TECHNIQUE: Multidetector CT imaging of the  abdomen and pelvis was performed using the standard protocol following bolus administration of intravenous contrast. RADIATION DOSE REDUCTION: This exam was performed according to the departmental dose-optimization program which includes automated exposure control, adjustment of the mA and/or kV according to patient size and/or use of iterative reconstruction technique. CONTRAST:  OMNIPAQUE  IOHEXOL  300 MG/ML  SOLN COMPARISON:  08/14/2023 FINDINGS: Lower chest: Mild dependent atelectasis in the lung bases. Hepatobiliary: No focal liver abnormality is seen. Status post cholecystectomy. No biliary dilatation. Pancreas: Unremarkable. No pancreatic ductal dilatation or surrounding inflammatory changes. Spleen: Normal in size without focal abnormality. Adrenals/Urinary Tract: Adrenal glands are unremarkable. Kidneys are normal, without renal calculi, focal lesion, or hydronephrosis. Bladder is unremarkable. Stomach/Bowel: Stomach, small bowel, and colon are not abnormally distended. No wall thickening or inflammatory stranding. Appendix is normal. Vascular/Lymphatic: No significant vascular findings are present. No enlarged abdominal or pelvic lymph nodes. Reproductive: Uterus and bilateral adnexa are unremarkable. Other: Small periumbilical hernia containing fat. No free air or free fluid in the abdomen. Musculoskeletal: Spondylolysis with mild spondylolisthesis at L5-S1. No acute bony abnormalities. IMPRESSION: No acute process demonstrated in the abdomen or pelvis. No evidence of bowel obstruction or inflammation. Electronically Signed   By: Elsie Gravely M.D.   On: 02/18/2024 21:07     Procedures   Medications Ordered in the ED  sodium chloride  0.9 % bolus 1,000 mL (1,000 mLs Intravenous New Bag/Given 02/18/24 1954)  iohexol  (OMNIPAQUE ) 300 MG/ML solution 100 mL (100 mLs Intravenous Contrast Given 02/18/24 2103)                                    Medical Decision Making Amount and/or Complexity of  Data Reviewed Labs: ordered. Radiology: ordered.  Risk Prescription drug management.   This patient is a 53 y.o. female who presents to the ED for concern of abdominal pain, this involves an extensive number of treatment options, and is a complaint that carries with it a high risk of complications and morbidity. The emergent differential diagnosis prior to evaluation includes, but is not limited to,  AAA, gastroenteritis, appendicitis, Bowel obstruction, Bowel perforation. Gastroparesis, DKA, Hernia, Inflammatory bowel disease, mesenteric ischemia, pancreatitis, peritonitis SBP, volvulus.  This is not an exhaustive differential.   Past Medical History / Co-morbidities / Social History:  has a past medical history of Anemia, Anxiety, Chronic back pain, Chronic pain, GERD (gastroesophageal reflux disease), Headache, PONV (postoperative nausea and vomiting), Thyroid  disease, and Wears glasses.  Additional history: Chart reviewed.  Physical Exam: Physical exam performed. The pertinent findings include: well appearing, epigastric and LUQ TTP without rebound or guarding  Lab Tests: I ordered, and personally  interpreted labs.  The pertinent results include: No acute laboratory abnormalities   Imaging Studies: I ordered imaging studies including CT abdomen pelvis. I independently visualized and interpreted imaging which showed   No acute process demonstrated in the abdomen or pelvis. No evidence of bowel obstruction or inflammation.  I agree with the radiologist interpretation.  Medications: I ordered medication including fluids for dehydration, offered pain medication which patient declined. Reevaluation of the patient after these medicines showed that the patient improved. I have reviewed the patients home medicines and have made adjustments as needed.  Disposition: After consideration of the diagnostic results and the patients response to treatment, I feel that emergency department  workup does not suggest an emergent condition requiring admission or immediate intervention beyond what has been performed at this time. The plan is: Discharge with close outpatient follow-up and return precautions.  After above interventions, patient feels better.  She is able to eat and drink without any nausea or vomiting.  Her workup is benign.  She has no signs or symptoms to suggest ACS or PE.  I suspect that these symptoms are side effects of her Ozempic given her recent increase in dosage.   Discussed this with patient is understanding.  Recommend that she discuss long-term efficacy of this medicine for her with her outpatient PCP. I did also discussed that this could be GERD, patient does state this feels different than her normal acid reflux.  I did offer her a GI cocktail Zofran , and Bentyl  which she declined, states she would prefer to just go home. Evaluation and diagnostic testing in the emergency department does not suggest an emergent condition requiring admission or immediate intervention beyond what has been performed at this time.  Plan for discharge with close PCP follow-up.  Patient is understanding and amenable with plan, educated on red flag symptoms that would prompt immediate return.  Patient discharged in stable condition.  Final diagnoses:  Left upper quadrant abdominal pain    ED Discharge Orders     None     An After Visit Summary was printed and given to the patient.      Nora Lauraine DELENA DEVONNA 02/18/24 2217    Doretha Folks, MD 02/18/24 [phone]

## 2024-02-18 NOTE — Discharge Instructions (Addendum)
 As we discussed, your workup in the ER today was reassuring for acute findings.  Laboratory evaluation and CT imaging did not reveal any emergent cause of your symptoms.  Your symptoms could be related to the increase in your Ozempic recently, recommend that you discuss with your doctor if you should continue this medicine or not.  It also could be acid reflux, I recommend that you take your home antacid medication for treatment of this.  Please call your PCP to schedule a close follow-up appointment.  Return if development of any new or worsening symptoms.

## 2024-02-18 NOTE — ED Notes (Signed)
 Reviewed discharge instructions and follow up care with pt. Pt verbalized understanding and had no further questions. Pt exited ED without complications.

## 2024-02-18 NOTE — ED Triage Notes (Signed)
 Pt reports LUQ abd pain x2 days. Pt seen at [facility] and sent to ED. Pt reports N/V/D on Tuesday. Pt denies any recent sick contacts. Pt reports recent increase in Ozempic type medication. Pt also reports recent COVID contact.

## 2024-02-18 NOTE — ED Notes (Signed)
 Patient transported to CT

## 2024-05-09 ENCOUNTER — Other Ambulatory Visit: Payer: Self-pay | Admitting: Medical Genetics

## 2024-05-09 DIAGNOSIS — Z006 Encounter for examination for normal comparison and control in clinical research program: Secondary | ICD-10-CM
